# Patient Record
Sex: Male | Born: 1970
Health system: Southern US, Community
[De-identification: ages and names within clinical notes are randomized; demographics above are authoritative.]

## PROBLEM LIST (undated history)

## (undated) ENCOUNTER — Emergency Department (HOSPITAL_COMMUNITY): Admission: EM | Payer: Self-pay | Source: Home / Self Care

## (undated) DIAGNOSIS — K5732 Diverticulitis of large intestine without perforation or abscess without bleeding: Secondary | ICD-10-CM

## (undated) DIAGNOSIS — K5792 Diverticulitis of intestine, part unspecified, without perforation or abscess without bleeding: Secondary | ICD-10-CM

## (undated) HISTORY — DX: Diverticulitis of intestine, part unspecified, without perforation or abscess without bleeding: K57.92

## (undated) HISTORY — DX: Diverticulitis of large intestine without perforation or abscess without bleeding: K57.32

---

## 2008-07-12 ENCOUNTER — Ambulatory Visit (HOSPITAL_COMMUNITY): Admission: RE | Admit: 2008-07-12 | Discharge: 2008-07-12 | Payer: Self-pay | Admitting: Internal Medicine

## 2008-10-20 ENCOUNTER — Emergency Department (HOSPITAL_COMMUNITY): Admission: EM | Admit: 2008-10-20 | Discharge: 2008-10-20 | Payer: Self-pay | Admitting: Emergency Medicine

## 2009-05-02 ENCOUNTER — Ambulatory Visit (HOSPITAL_COMMUNITY): Admission: RE | Admit: 2009-05-02 | Discharge: 2009-05-02 | Payer: Self-pay | Admitting: Internal Medicine

## 2009-06-16 IMAGING — CR DG KNEE COMPLETE 4+V*R*
5 series · 5 of 5 positions shown · non-contrast
Comparison: None available.

CLINICAL DATA: Knee pain.

RIGHT KNEE - COMPLETE 4+ VIEW

[view not recorded (1 of 5)]
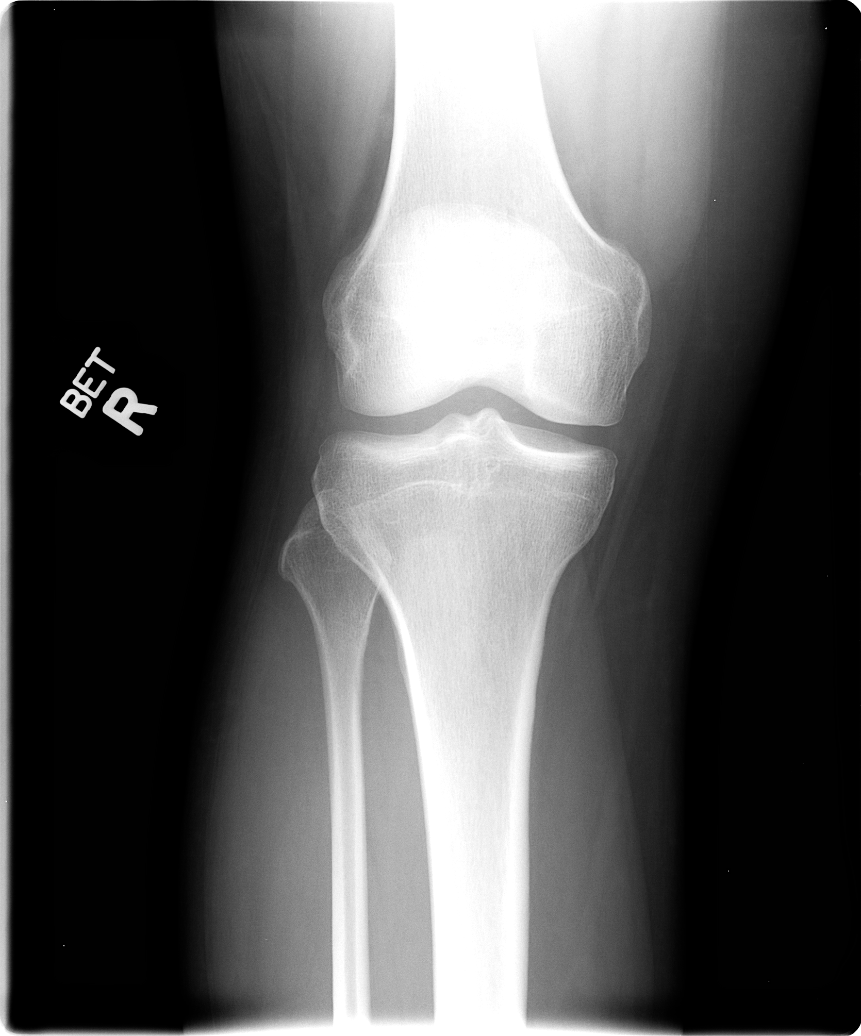

[view not recorded (2 of 5)]
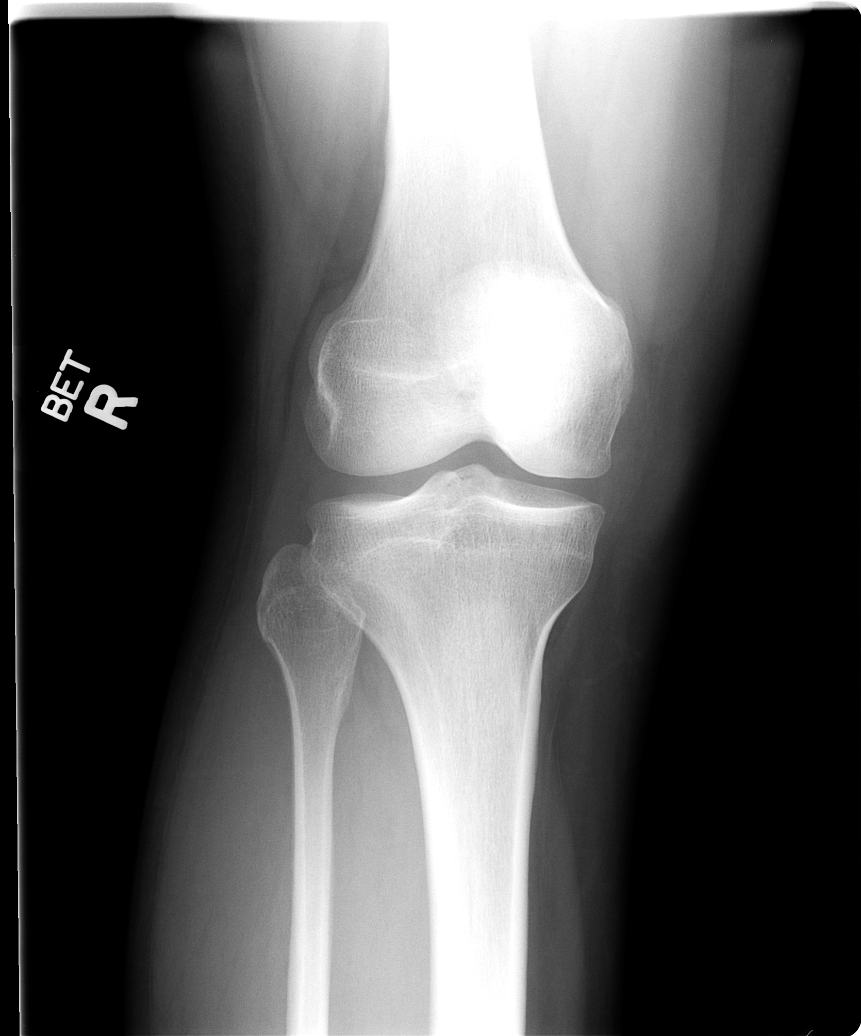

[view not recorded (3 of 5)]
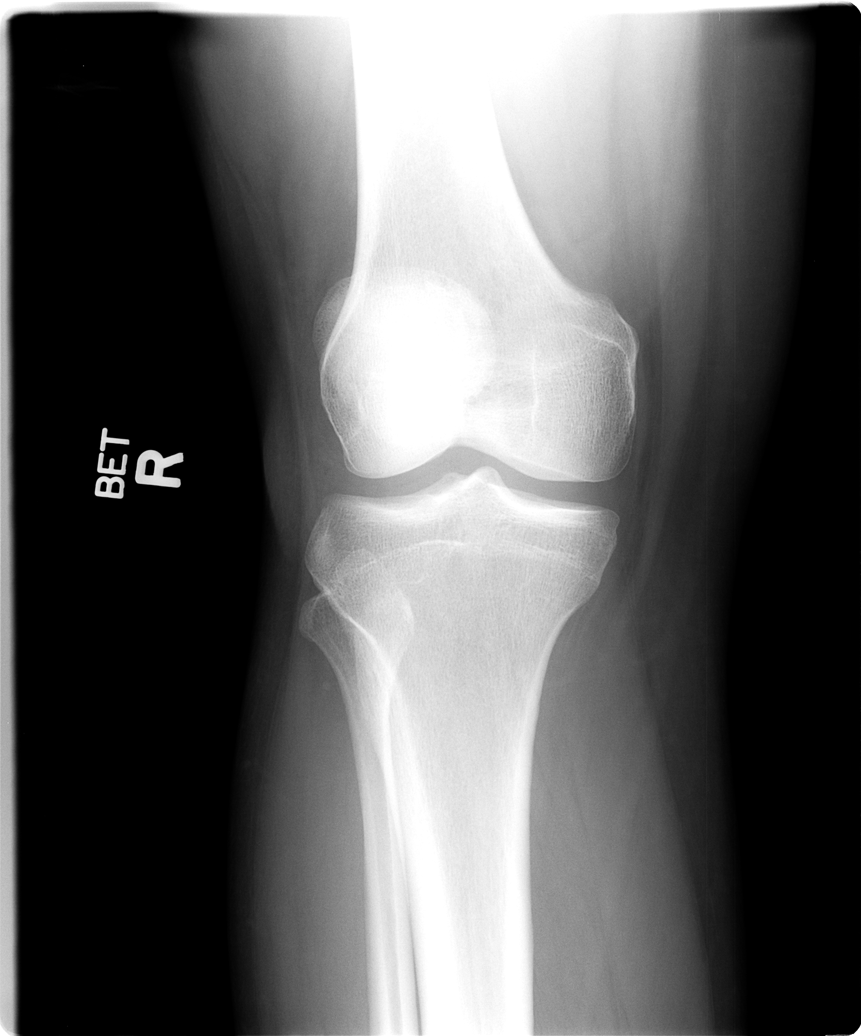

[view not recorded (4 of 5)]
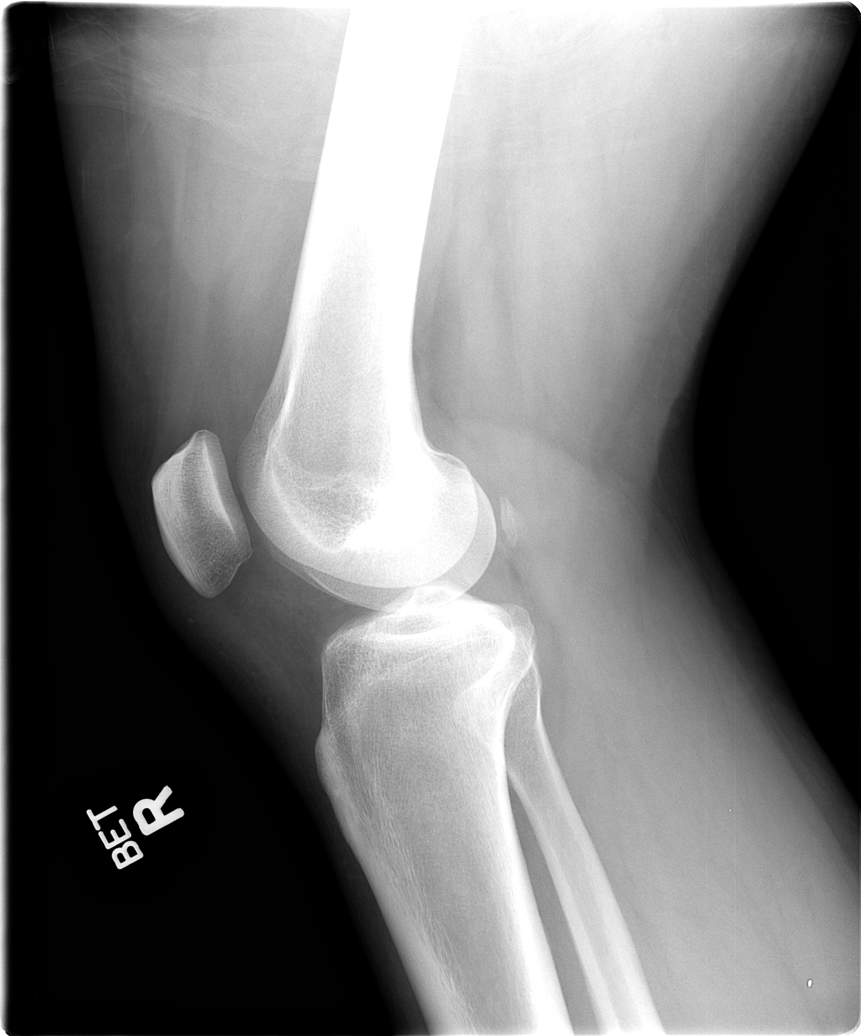

[view not recorded (5 of 5)]
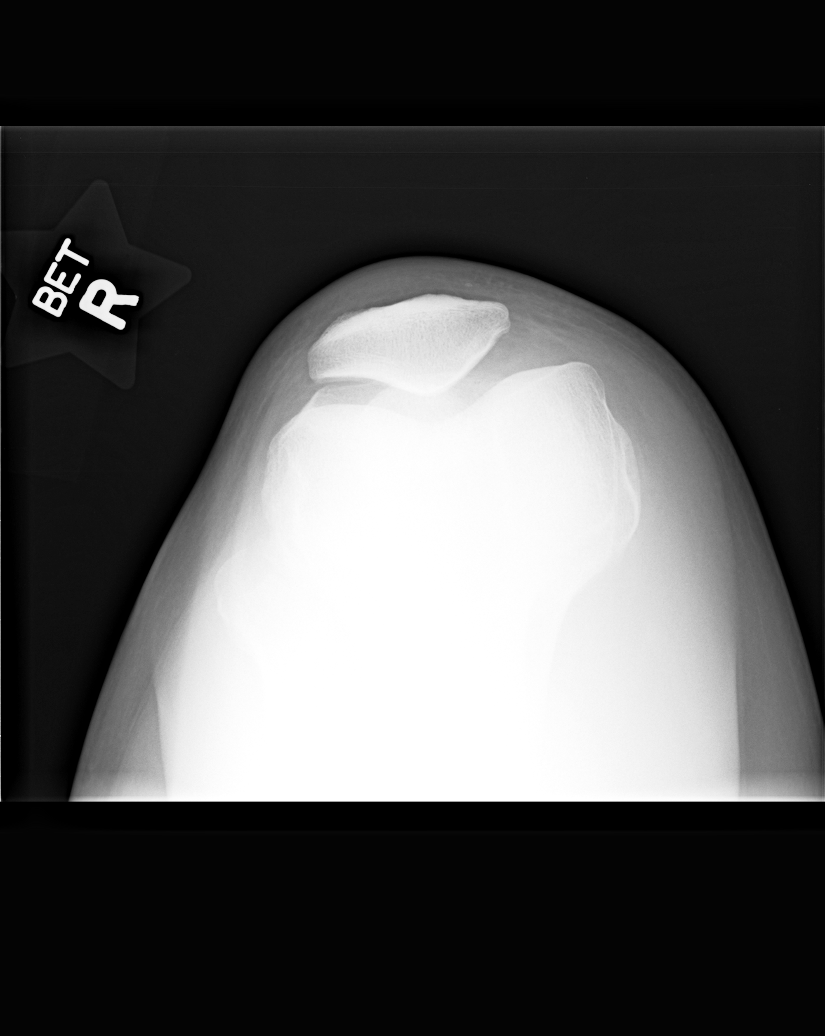

[5 of 5 positions shown; findings below may reference images not displayed]

FINDINGS: Imaged bones, joints and soft tissues appear normal. No
joint effusion.
IMPRESSION: Negative exam.

## 2009-08-12 ENCOUNTER — Emergency Department (HOSPITAL_COMMUNITY): Admission: EM | Admit: 2009-08-12 | Discharge: 2009-08-12 | Payer: Self-pay | Admitting: Emergency Medicine

## 2010-04-06 IMAGING — CR DG FOOT COMPLETE 3+V*R*
3 series · 3 of 3 positions shown · non-contrast
Comparison: None

CLINICAL DATA: Right lateral metatarsal pain.

RIGHT FOOT COMPLETE - 3+ VIEW

[view not recorded (1 of 3)]
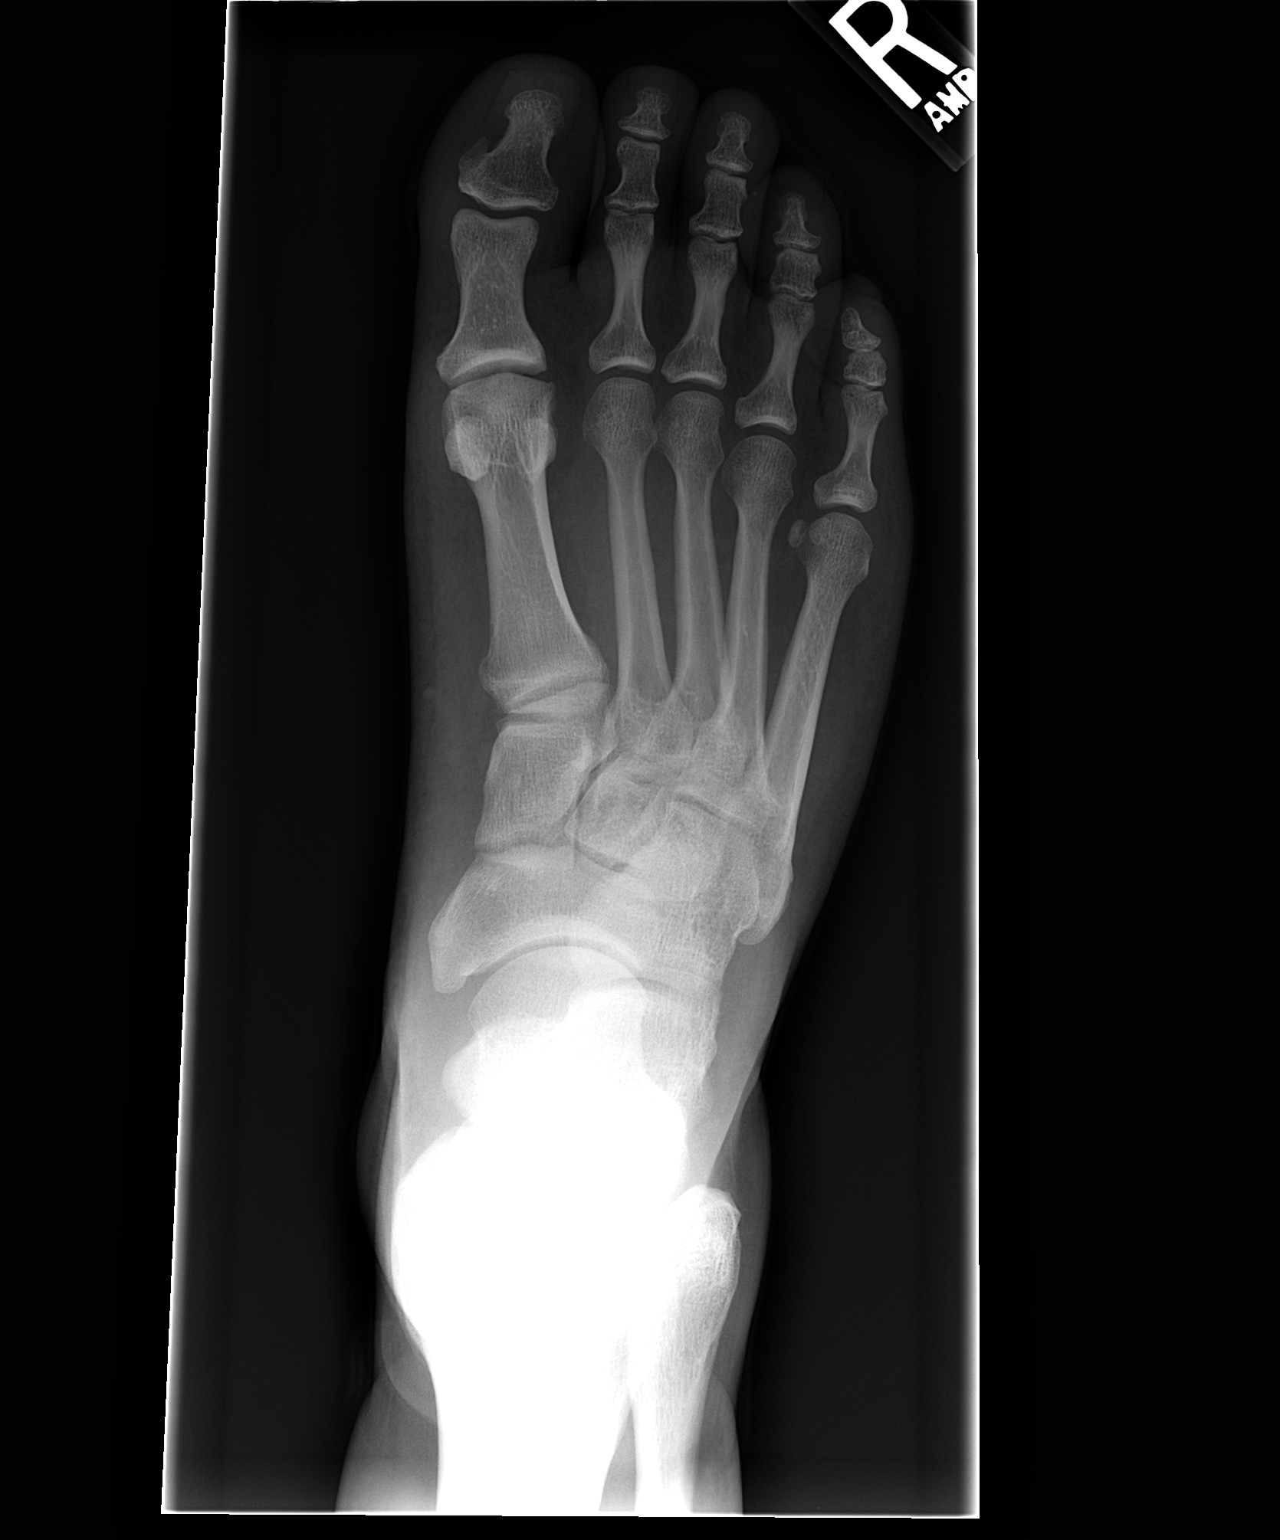

[view not recorded (2 of 3)]
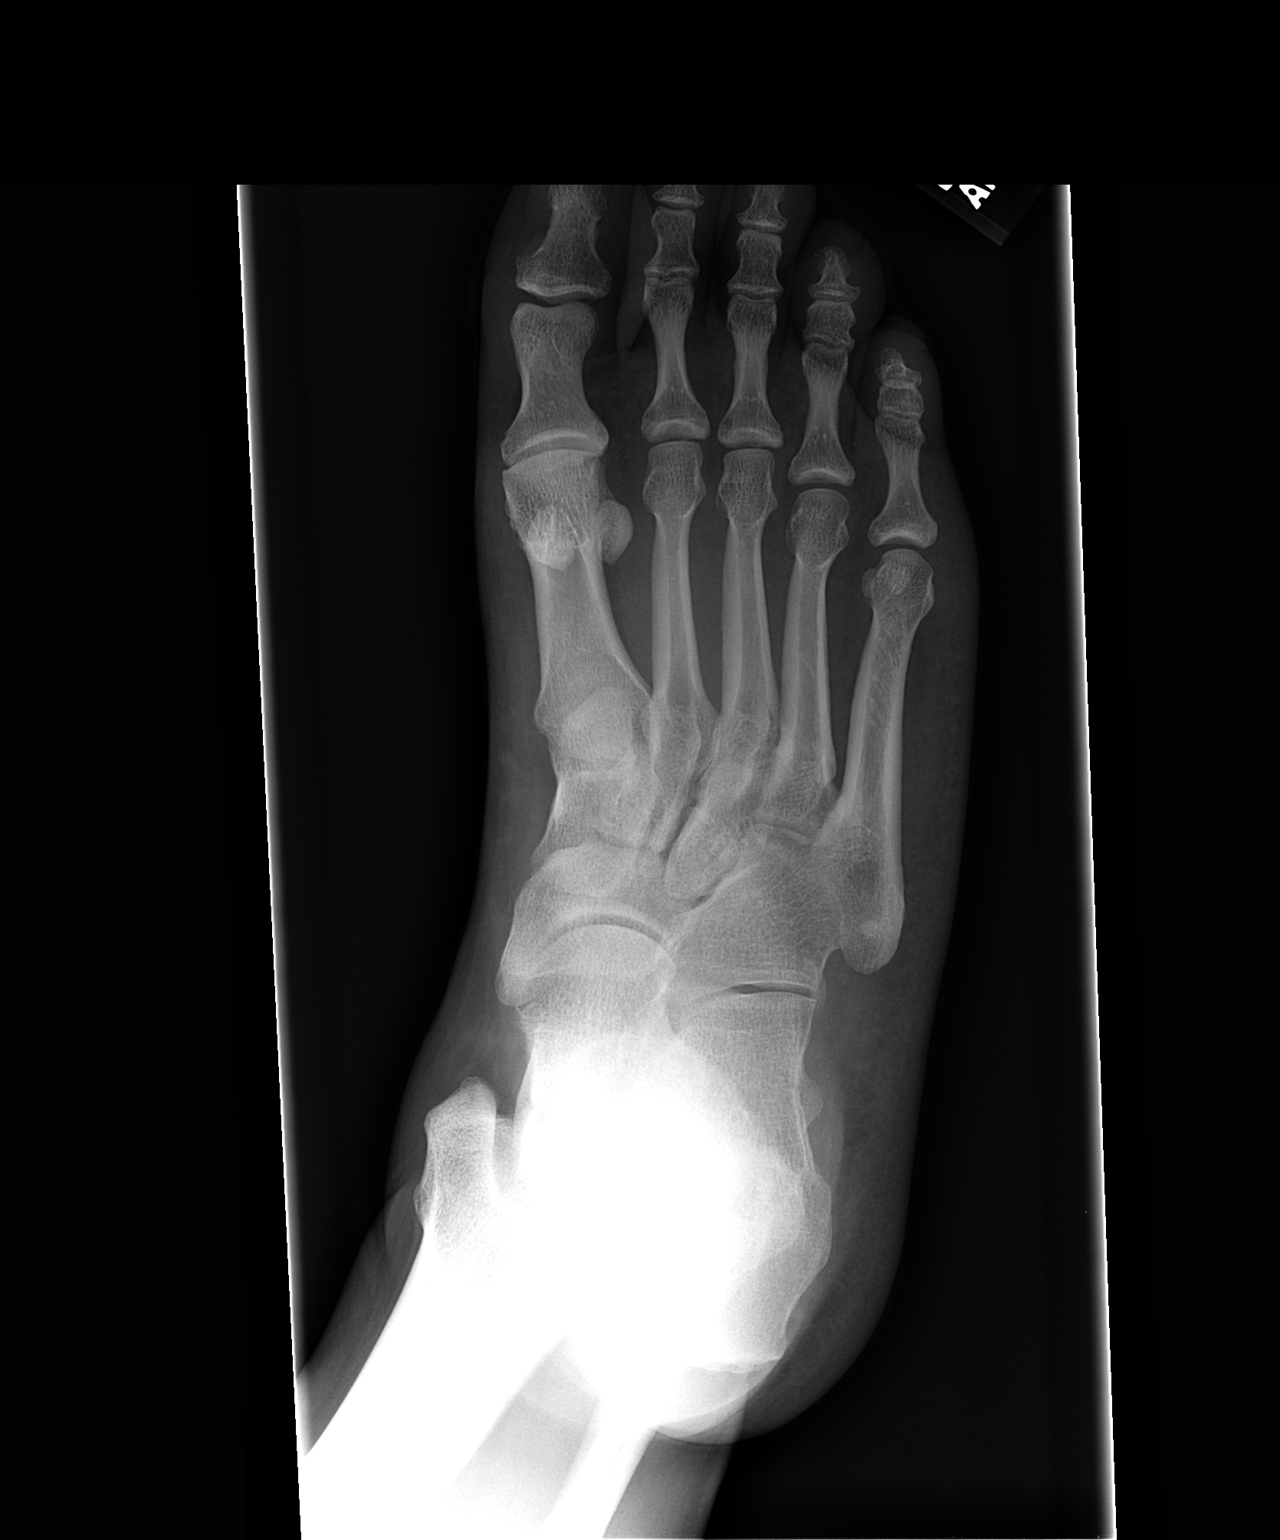

[view not recorded (3 of 3)]
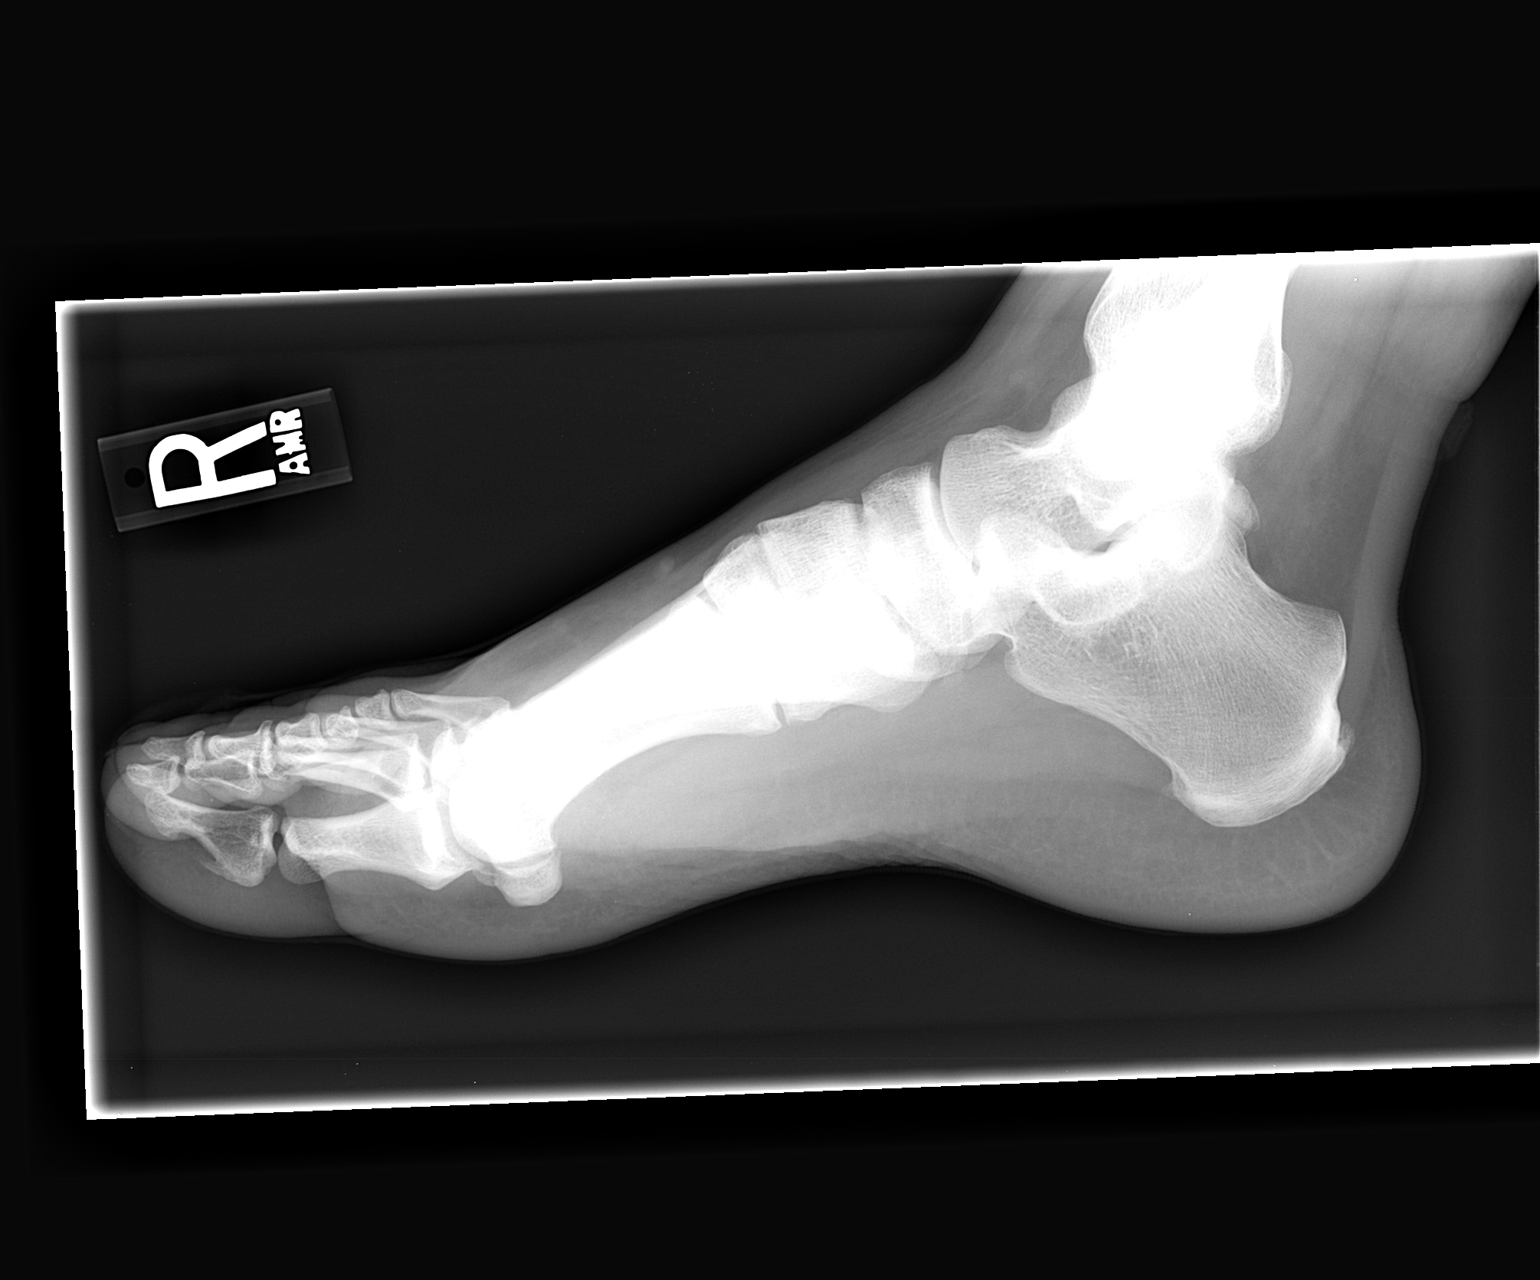

[3 of 3 positions shown; findings below may reference images not displayed]

FINDINGS: Early degenerative changes in the first MTP joint. No
acute bony abnormality.  Specifically, no fracture, subluxation, or
dislocation.  Soft tissues are intact.
IMPRESSION: No acute bony abnormality.

## 2016-08-13 DIAGNOSIS — T781XXD Other adverse food reactions, not elsewhere classified, subsequent encounter: Secondary | ICD-10-CM | POA: Diagnosis not present

## 2016-08-13 DIAGNOSIS — L509 Urticaria, unspecified: Secondary | ICD-10-CM | POA: Diagnosis not present

## 2016-08-13 DIAGNOSIS — J3081 Allergic rhinitis due to animal (cat) (dog) hair and dander: Secondary | ICD-10-CM | POA: Diagnosis not present

## 2017-05-26 DIAGNOSIS — K4091 Unilateral inguinal hernia, without obstruction or gangrene, recurrent: Secondary | ICD-10-CM | POA: Diagnosis not present

## 2017-05-28 DIAGNOSIS — N410 Acute prostatitis: Secondary | ICD-10-CM | POA: Diagnosis not present

## 2017-05-30 ENCOUNTER — Emergency Department (HOSPITAL_COMMUNITY)
Admission: EM | Admit: 2017-05-30 | Discharge: 2017-05-30 | Disposition: A | Discharge status: Home / Self Care | Payer: 59 | Attending: Emergency Medicine | Admitting: Emergency Medicine

## 2017-05-30 ENCOUNTER — Other Ambulatory Visit: Payer: Self-pay

## 2017-05-30 ENCOUNTER — Emergency Department (HOSPITAL_COMMUNITY): Payer: 59

## 2017-05-30 ENCOUNTER — Encounter (HOSPITAL_COMMUNITY): Payer: Self-pay

## 2017-05-30 DIAGNOSIS — K5732 Diverticulitis of large intestine without perforation or abscess without bleeding: Secondary | ICD-10-CM | POA: Diagnosis not present

## 2017-05-30 DIAGNOSIS — F17210 Nicotine dependence, cigarettes, uncomplicated: Secondary | ICD-10-CM | POA: Insufficient documentation

## 2017-05-30 DIAGNOSIS — R1030 Lower abdominal pain, unspecified: Secondary | ICD-10-CM | POA: Diagnosis not present

## 2017-05-30 DIAGNOSIS — K4090 Unilateral inguinal hernia, without obstruction or gangrene, not specified as recurrent: Secondary | ICD-10-CM | POA: Diagnosis not present

## 2017-05-30 DIAGNOSIS — K5792 Diverticulitis of intestine, part unspecified, without perforation or abscess without bleeding: Secondary | ICD-10-CM | POA: Diagnosis not present

## 2017-05-30 LAB — COMPREHENSIVE METABOLIC PANEL
ALK PHOS: 70 U/L (ref 38–126)
ALT: 53 U/L (ref 17–63)
AST: 31 U/L (ref 15–41)
Albumin: 4 g/dL (ref 3.5–5.0)
Anion gap: 8 (ref 5–15)
BILIRUBIN TOTAL: 0.4 mg/dL (ref 0.3–1.2)
BUN: 16 mg/dL (ref 6–20)
CALCIUM: 9.4 mg/dL (ref 8.9–10.3)
CHLORIDE: 104 mmol/L (ref 101–111)
CO2: 24 mmol/L (ref 22–32)
CREATININE: 0.95 mg/dL (ref 0.61–1.24)
GFR calc Af Amer: >60 mL/min (ref >60)
Glucose, Bld: 121 mg/dL — ABNORMAL HIGH (ref 65–99)
Potassium: 4.4 mmol/L (ref 3.5–5.1)
Sodium: 136 mmol/L (ref 135–145)
TOTAL PROTEIN: 7.4 g/dL (ref 6.5–8.1)

## 2017-05-30 LAB — CBC WITH DIFFERENTIAL/PLATELET
Basophils Absolute: 0 10*3/uL (ref 0.0–0.1)
Basophils Relative: 0 %
EOS ABS: 0 10*3/uL (ref 0.0–0.7)
EOS PCT: 0 %
HCT: 47.7 % (ref 39.0–52.0)
Hemoglobin: 15.8 g/dL (ref 13.0–17.0)
LYMPHS ABS: 1.9 10*3/uL (ref 0.7–4.0)
Lymphocytes Relative: 20 %
MCH: 29.8 pg (ref 26.0–34.0)
MCHC: 33.1 g/dL (ref 30.0–36.0)
MCV: 89.8 fL (ref 78.0–100.0)
Monocytes Absolute: 0.8 10*3/uL (ref 0.1–1.0)
Monocytes Relative: 8 %
Neutro Abs: 7 10*3/uL (ref 1.7–7.7)
Neutrophils Relative %: 72 %
PLATELETS: 253 10*3/uL (ref 150–400)
RBC: 5.31 MIL/uL (ref 4.22–5.81)
RDW: 13.1 % (ref 11.5–15.5)
WBC: 9.7 10*3/uL (ref 4.0–10.5)

## 2017-05-30 LAB — LIPASE, BLOOD: LIPASE: 56 U/L — AB (ref 11–51)

## 2017-05-30 MED ORDER — METRONIDAZOLE 500 MG PO TABS
500.0000 mg | ORAL_TABLET | Freq: Once | ORAL | Status: AC
  Administered 2017-05-30: 500 mg via ORAL
  Filled 2017-05-30: qty 1

## 2017-05-30 MED ORDER — CIPROFLOXACIN HCL 500 MG PO TABS: 500.0000 mg | ORAL_TABLET | Freq: Two times a day (BID) | ORAL | 0 refills | Status: DC

## 2017-05-30 MED ORDER — METRONIDAZOLE 500 MG PO TABS: 500.0000 mg | ORAL_TABLET | Freq: Three times a day (TID) | ORAL | 0 refills | Status: DC

## 2017-05-30 MED ORDER — SODIUM CHLORIDE 0.9 % IV BOLUS (SEPSIS)
1000.0000 mL | Freq: Once | INTRAVENOUS | Status: AC
  Administered 2017-05-30: 1000 mL via INTRAVENOUS

## 2017-05-30 MED ORDER — METRONIDAZOLE IN NACL 5-0.79 MG/ML-% IV SOLN: 500.0000 mg | Freq: Once | INTRAVENOUS | Status: DC

## 2017-05-30 NOTE — ED Notes (Signed)
Pt returned from CT Scan 

## 2017-05-30 NOTE — ED Provider Notes (Signed)
[facility] EMERGENCY DEPARTMENT Provider Note   CSN: [id number] Arrival date & time: 05/30/17  4098  Time seen 05:45 AM   History   Chief Complaint Chief Complaint  Patient presents with  . Abdominal Pain    HPI Benjamin Curtis is a 47 y.o. male.  HPI patient states he has had some swelling in his groin for about 12 years.  However he states that it got worse about a week ago.  He was seen by his PCP 1 week ago and states he pushed on it" made it go back again".  He states afterwards the pain was better.  However March 19 he said he had fever of 101.8.  He called his PCP back and they called him in Cipro.  They also did blood work and a urinalysis the following day and he was told they were normal.  He states he had no pain the 19th through the 21st.  Please note initially patient told me he had had abdominal pain constantly since last week.  He states the pain started gradually.  The pain is in his lower abdomen bilaterally.  He describes it as sharp.  He states bending over or putting pressure on his abdomen makes it hurt more, nothing makes it feel better.  He is tried gas pills without relief.  He states the pain returned during the night and woke him up about 4 AM.  He states the pain is more intense than it was when he was seen a week ago.  He denies fever, nausea, vomiting, diarrhea, or constipation.  He states he did have constipation earlier in the week but not now.  He denies any prior abdominal surgeries.  He states he saw a Careers adviser in 2017 about his hernia and he has a appointment on April 4.  After telling me he had swelling in his groin and that his PCP reduce the hernia he told me he does not notice much difference in the swelling in his groin.  PCP Carylon Perches, MD  History reviewed. No pertinent past medical history.  There are no active problems to display for this patient.   History reviewed. No pertinent surgical history.     Home Medications    Prior to  Admission medications   Medication Sig Start Date End Date Taking? Authorizing Provider  ciprofloxacin (CIPRO) 500 MG tablet Take 500 mg by mouth 2 (two) times daily. 05/26/17 06/05/17 Yes [provider]  ciprofloxacin (CIPRO) 500 MG tablet Take 1 tablet (500 mg total) by mouth 2 (two) times daily. 05/30/17   Devoria Albe, MD  metroNIDAZOLE (FLAGYL) 500 MG tablet Take 1 tablet (500 mg total) by mouth 3 (three) times daily. 05/30/17   Devoria Albe, MD    Family History Family History  Problem Relation Age of Onset  . Diabetes Mother   . Hypertension Father     Social History Social History   Tobacco Use  . Smoking status: Current Every Day Smoker    Packs/day: 1.00    Years: 20.00    Pack years: 20.00    Types: Cigarettes  . Smokeless tobacco: Never Used  Substance Use Topics  . Alcohol use: Never    Frequency: Never  . Drug use: Never  employed Smokes 4 cigs a day  Allergies   Penicillins   Review of Systems Review of Systems  All other systems reviewed and are negative.    Physical Exam Updated Vital Signs BP (!) 151/82 (BP Location: Left  Arm)   Pulse 96   Temp 99.4 F (37.4 C) (Oral)   Resp 18   Ht 5\' 6"  (1.676 m)   Wt 108.9 kg (240 lb)   SpO2 98%   BMI 38.74 kg/m   Vital signs normal except for hypertension   Physical Exam  Constitutional: He is oriented to person, place, and time. He appears well-developed and well-nourished.  Non-toxic appearance. He does not appear ill. No distress.  HENT:  Head: Normocephalic and atraumatic.  Right Ear: External ear normal.  Left Ear: External ear normal.  Nose: Nose normal. No mucosal edema or rhinorrhea.  Mouth/Throat: Oropharynx is clear and moist and mucous membranes are normal. No dental abscesses or uvula swelling.  Eyes: Pupils are equal, round, and reactive to light. Conjunctivae and EOM are normal.  Neck: Normal range of motion and full passive range of motion without pain. Neck supple.    Cardiovascular: Normal rate, regular rhythm and normal heart sounds. Exam reveals no gallop and no friction rub.  No murmur heard. Pulmonary/Chest: Effort normal and breath sounds normal. No respiratory distress. He has no wheezes. He has no rhonchi. He has no rales. He exhibits no tenderness and no crepitus.  Abdominal: Soft. Normal appearance and bowel sounds are normal. He exhibits no distension. There is tenderness in the right lower quadrant, suprapubic area and left lower quadrant. There is no rebound and no guarding.  Patient is most tender in the right lower quadrant, palpation of the left lower quadrant causes pain in the right lower quadrant.  Genitourinary: Testes normal and penis normal. Right testis shows no mass and no tenderness. Left testis shows no mass, no swelling and no tenderness.  Genitourinary Comments: Patient has some fullness in his right inguinal area without distinct mass.  Musculoskeletal: Normal range of motion. He exhibits no edema or tenderness.  Moves all extremities well.   Neurological: He is alert and oriented to person, place, and time. He has normal strength. No cranial nerve deficit.  Skin: Skin is warm, dry and intact. No rash noted. No erythema. No pallor.  Psychiatric: He has a normal mood and affect. His speech is normal and behavior is normal. His mood appears not anxious.  Nursing note and vitals reviewed.    ED Treatments / Results  Labs (all labs ordered are listed, but only abnormal results are displayed) Results for orders placed or performed during the hospital encounter of 05/30/17  Comprehensive metabolic panel  Result Value Ref Range   Sodium 136 135 - 145 mmol/L   Potassium 4.4 3.5 - 5.1 mmol/L   Chloride 104 101 - 111 mmol/L   CO2 24 22 - 32 mmol/L   Glucose, Bld 121 (H) 65 - 99 mg/dL   BUN 16 6 - 20 mg/dL   Creatinine, Ser 1.61 0.61 - 1.24 mg/dL   Calcium 9.4 8.9 - 09.6 mg/dL   Total Protein 7.4 6.5 - 8.1 g/dL   Albumin 4.0 3.5  - 5.0 g/dL   AST 31 15 - 41 U/L   ALT 53 17 - 63 U/L   Alkaline Phosphatase 70 38 - 126 U/L   Total Bilirubin 0.4 0.3 - 1.2 mg/dL   GFR calc non Af Amer >60 >60 mL/min   GFR calc Af Amer >60 >60 mL/min   Anion gap 8 5 - 15  CBC with Differential  Result Value Ref Range   WBC 9.7 4.0 - 10.5 K/uL   RBC 5.31 4.22 - 5.81 MIL/uL  Hemoglobin 15.8 13.0 - 17.0 g/dL   HCT 40.9 81.1 - 91.4 %   MCV 89.8 78.0 - 100.0 fL   MCH 29.8 26.0 - 34.0 pg   MCHC 33.1 30.0 - 36.0 g/dL   RDW 78.2 95.6 - 21.3 %   Platelets 253 150 - 400 K/uL   Neutrophils Relative % 72 %   Neutro Abs 7.0 1.7 - 7.7 K/uL   Lymphocytes Relative 20 %   Lymphs Abs 1.9 0.7 - 4.0 K/uL   Monocytes Relative 8 %   Monocytes Absolute 0.8 0.1 - 1.0 K/uL   Eosinophils Relative 0 %   Eosinophils Absolute 0.0 0.0 - 0.7 K/uL   Basophils Relative 0 %   Basophils Absolute 0.0 0.0 - 0.1 K/uL  Lipase, blood  Result Value Ref Range   Lipase 56 (H) 11 - 51 U/L     Laboratory interpretation all normal except mildly elevated lipase      EKG  EKG Interpretation None       #1 06:19 AM ED ECG REPORT   Date: 05/30/2017  Rate: 82  Rhythm: normal sinus rhythm  QRS Axis: normal  Intervals: normal  ST/T Wave abnormalities: normal  Conduction Disutrbances:none  Narrative Interpretation: baseline wander  Old EKG Reviewed: none available  I have personally reviewed the EKG tracing and agree with the computerized printout as noted.  #2 EKG 06:49 AM ED ECG REPORT   Date: 05/30/2017  Rate: 94  Rhythm: normal sinus rhythm  QRS Axis: normal  Intervals: normal  ST/T Wave abnormalities: normal  Conduction Disutrbances:none  Narrative Interpretation:   Old EKG Reviewed: unchanged  I have personally reviewed the EKG tracing and agree with the computerized printout as noted.   Radiology Ct Abdomen Pelvis Wo Contrast  Result Date: 05/30/2017 CLINICAL DATA:  Acute presentation with abdominal pain over the last week.  History of right inguinal hernia. EXAM: CT ABDOMEN AND PELVIS WITHOUT CONTRAST TECHNIQUE: Multidetector CT imaging of the abdomen and pelvis was performed following the standard protocol without IV contrast. COMPARISON:  None. FINDINGS: Lower chest: Normal Hepatobiliary: Liver parenchyma is normal. Initially, there appeared to be a 15 mm low-density at the dome of the liver, but on the reconstructed images, this can be seen to be artifactual. No calcified gallstones. Pancreas: Normal Spleen: Normal Adrenals/Urinary Tract: Adrenal glands are normal. Kidneys are normal. Bladder is normal. Stomach/Bowel: No evidence of bowel obstruction. The patient has diverticulosis of the left colon. There is marked wall thickening in the sigmoid colon with surrounding inflammatory change in the fat consistent with acute diverticulitis. No specific finding to suggest mass lesion, though that is never excluded at initial presentation. No sign of abscess. Vascular/Lymphatic: Aortic atherosclerosis. No aneurysm. IVC is normal. No retroperitoneal adenopathy. Reproductive: Normal Other: Bilateral inguinal hernias containing only fat, larger on the right than the left. No evidence incarcerated bowel or obstruction. Musculoskeletal: Ordinary degenerative changes of the lumbar spine. IMPRESSION: Acute diverticulitis of the sigmoid colon without evidence of abscess. At initial presentation, underlying sigmoid colon mass lesion is never excluded, but the findings may simply be due to diverticulitis. Routine treatment and follow-up suggested. Bilateral inguinal hernias right larger than left containing only fat. Electronically Signed   By: Paulina Fusi M.D.   On: 05/30/2017 07:13    Procedures Procedures (including critical care time)  Medications Ordered in ED Medications  metroNIDAZOLE (FLAGYL) tablet 500 mg (has no administration in time range)  sodium chloride 0.9 % bolus 1,000 mL (1,000 mLs Intravenous  New Bag/Given 05/30/17 [id number])      Initial Impression / Assessment and Plan / ED Course  I have reviewed the triage vital signs and the nursing notes.  Pertinent labs & imaging results that were available during my care of the patient were reviewed by me and considered in my medical decision making (see chart for details).      Patient was given IV fluids, he was offered pain medicine however he refused. He states he doesn't like to take things like that.    CT scan and  laboratory testing was ordered.  6:20 AM nursing staff came to me and said patient had a syncopal episode for like 10 seconds.  They state he had a normal blood pressure and pulse.  He had the episode while they were starting his IV.  Patient states he has had syncope in the past with donating blood.  7:20 AM we discussed his CT result.  Patient would prefer to get the Flagyl orally.  We discussed dietary changes for the next couple days and then once his acute flareup has resolved he can go back to the diet for diverticulosis.  We discussed he does have bilateral inguinal hernias however they both contain fat so unless they are causing him discomfort he really does not need to have surgery unless he wants to go ahead and see the surgeon.  Final Clinical Impressions(s) / ED Diagnoses   Final diagnoses:  Diverticulitis    ED Discharge Orders        Ordered    ciprofloxacin (CIPRO) 500 MG tablet  2 times daily     05/30/17 0728    metroNIDAZOLE (FLAGYL) 500 MG tablet  3 times daily     05/30/17 [id number]      Plan discharge  Devoria AlbeIva [employer]i [employer], MD, [facility]oFACEP    Theron Cumbie, MD 05/30/17 [phone]

## 2017-05-30 NOTE — ED Notes (Signed)
Before attempting to begin IV Therapy, PT turned pale and became momentarily unresponsive. When dropped to supine position RN performed a quick sternal rub and Pt began to come back to reality. Pt now diaphoretic and feels warm.

## 2017-05-30 NOTE — Discharge Instructions (Addendum)
You need to be on a liquid diet today and tomorrow, if you are doing well tomorrow afternoon you can start a bland diet.  Take the antibiotics until gone for a total of 10 days both the Cipro and the Flagyl.  Please let Dr. Alonza SmokerFagan's office know about your diagnosis.  Return to the emergency department if you get fever, or have uncontrolled vomiting or pain.  Once your symptoms have improved you need to try to eat a high-fiber diet to help prevent recurrence of diverticulitis.

## 2017-05-30 NOTE — ED Notes (Signed)
Patient transported to CT 

## 2017-05-30 NOTE — ED Triage Notes (Signed)
Pt arrived via POV from home c/o abdominal pain X1 week. Abdominal pain extends from diaphragm to groin. Pt does report a tiny right inguinal hernia but does not think it is the cause of your pain.

## 2017-06-10 ENCOUNTER — Ambulatory Visit: Payer: 59 | Admitting: General Surgery

## 2017-06-12 ENCOUNTER — Ambulatory Visit: Payer: 59 | Admitting: General Surgery

## 2017-06-19 ENCOUNTER — Ambulatory Visit: Payer: 59 | Admitting: General Surgery

## 2017-08-14 DIAGNOSIS — T781XXD Other adverse food reactions, not elsewhere classified, subsequent encounter: Secondary | ICD-10-CM | POA: Diagnosis not present

## 2017-08-14 DIAGNOSIS — J3081 Allergic rhinitis due to animal (cat) (dog) hair and dander: Secondary | ICD-10-CM | POA: Diagnosis not present

## 2017-08-14 DIAGNOSIS — L509 Urticaria, unspecified: Secondary | ICD-10-CM | POA: Diagnosis not present

## 2017-10-12 DIAGNOSIS — K5792 Diverticulitis of intestine, part unspecified, without perforation or abscess without bleeding: Secondary | ICD-10-CM | POA: Diagnosis not present

## 2018-04-13 DIAGNOSIS — K5792 Diverticulitis of intestine, part unspecified, without perforation or abscess without bleeding: Secondary | ICD-10-CM | POA: Diagnosis not present

## 2018-04-30 ENCOUNTER — Ambulatory Visit (INDEPENDENT_AMBULATORY_CARE_PROVIDER_SITE_OTHER): Payer: 59 | Admitting: Internal Medicine

## 2018-05-04 IMAGING — CT CT ABD-PELV W/O CM
2 of 4 series · 17 of 46 positions shown, 19 images · non-contrast
Comparison: None.

CLINICAL DATA: Acute presentation with abdominal pain over the last
week. History of right inguinal hernia.

EXAM:
CT ABDOMEN AND PELVIS WITHOUT CONTRAST
TECHNIQUE: Multidetector CT imaging of the abdomen and pelvis was performed
following the standard protocol without IV contrast.

[Series 2: axial st · axial · 0.96mm/px · z∈[+1136,+1521]mm · 14 of 87 slices shown, 16 images]
[im 5/87  soft-tissue]
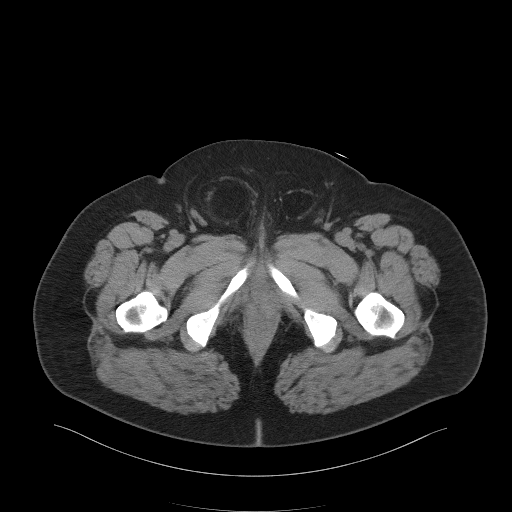
[im 5/87  bone]
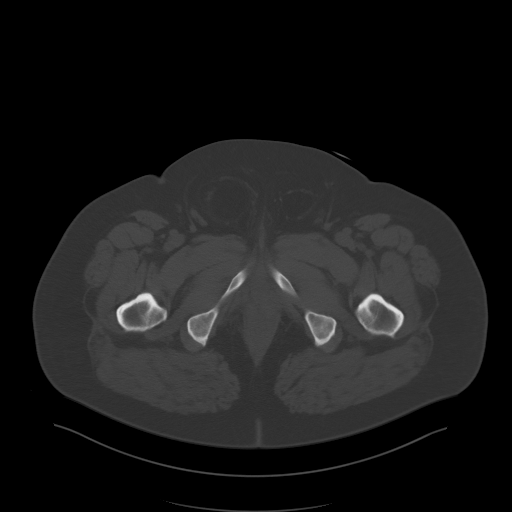
[im 13/87  soft-tissue]
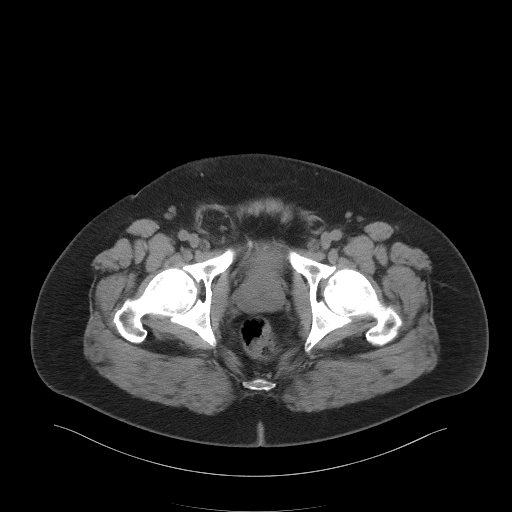
[im 17/87  soft-tissue]
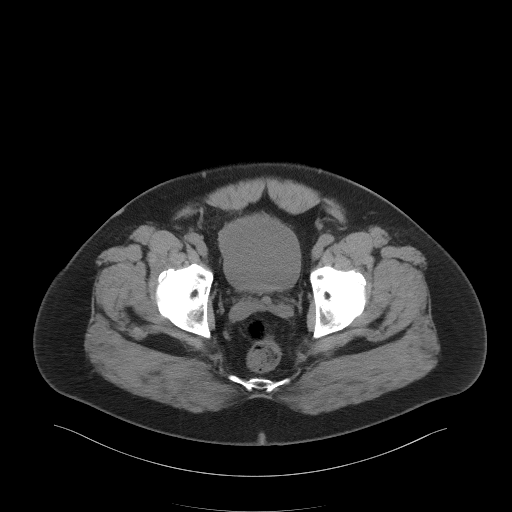
[im 25/87  soft-tissue]
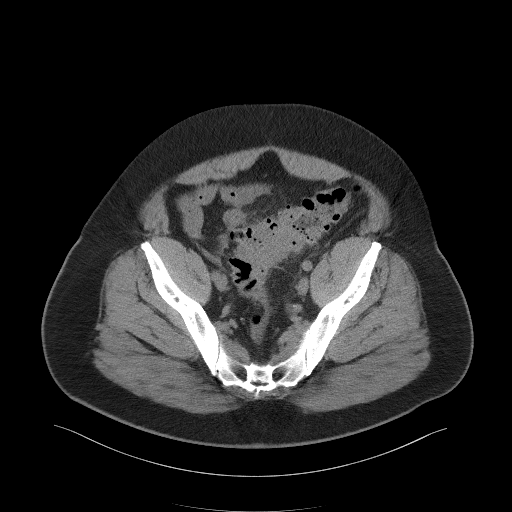
[im 29/87  soft-tissue]
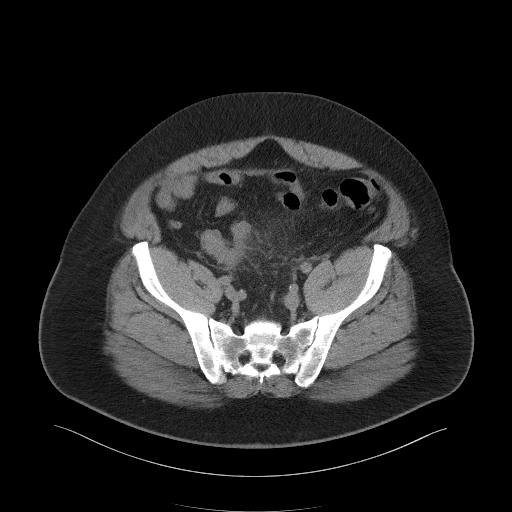
[im 33/87  soft-tissue]
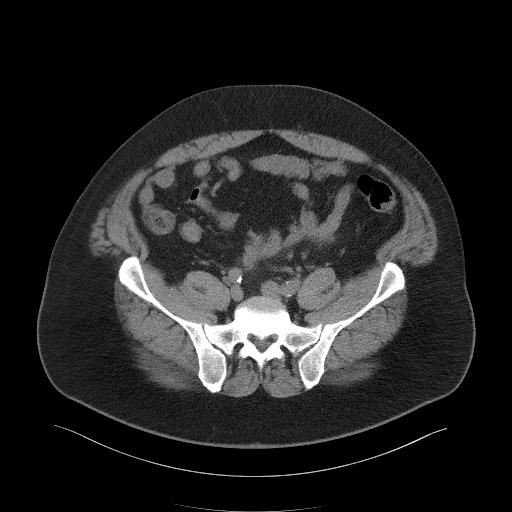
[im 41/87  soft-tissue]
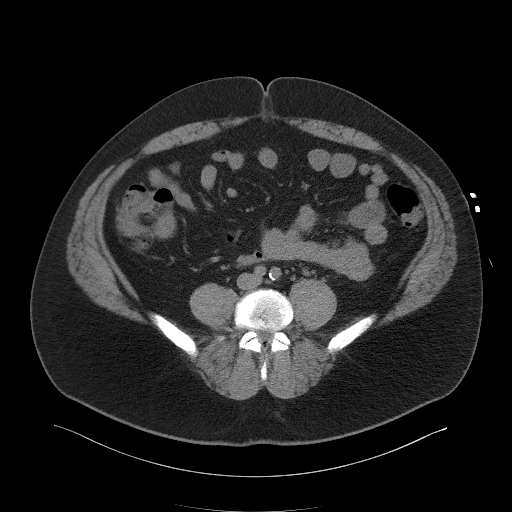
[im 46/87  soft-tissue]
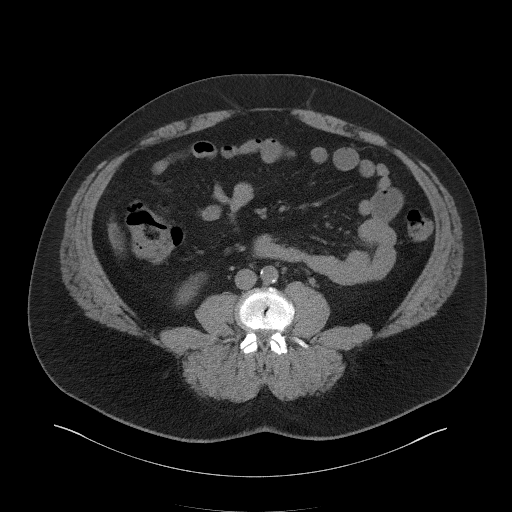
[im 54/87  soft-tissue]
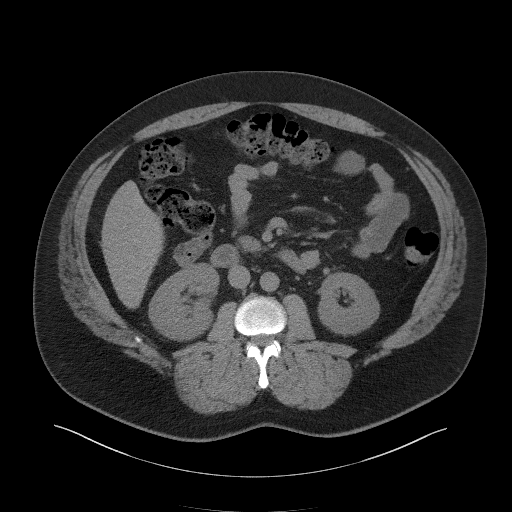
[im 54/87  bone]
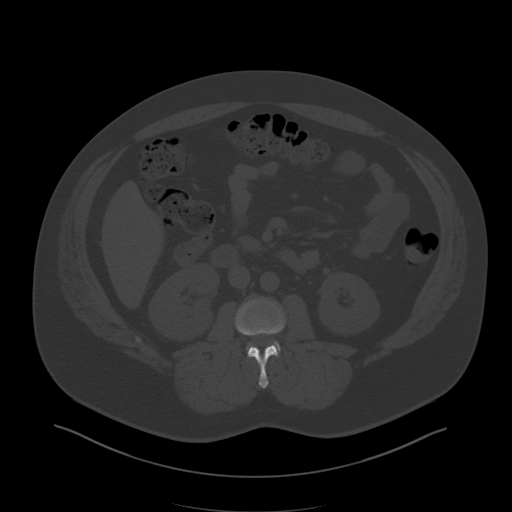
[im 58/87  soft-tissue]
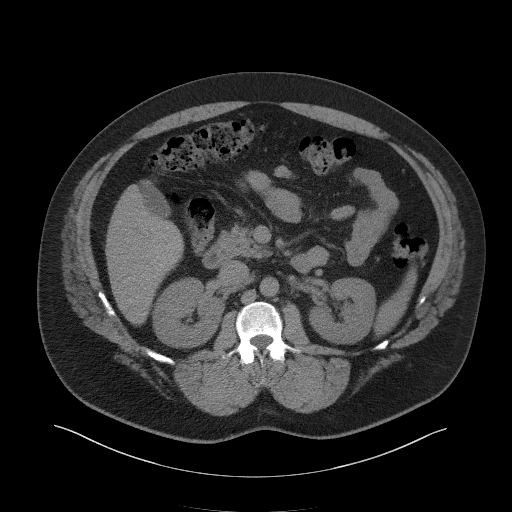
[im 66/87  soft-tissue]
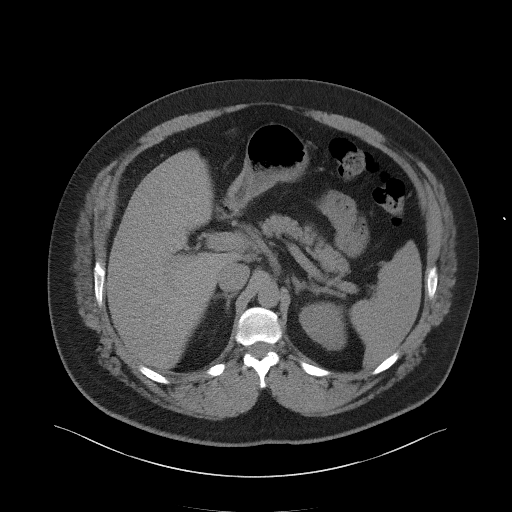
[im 70/87  soft-tissue]
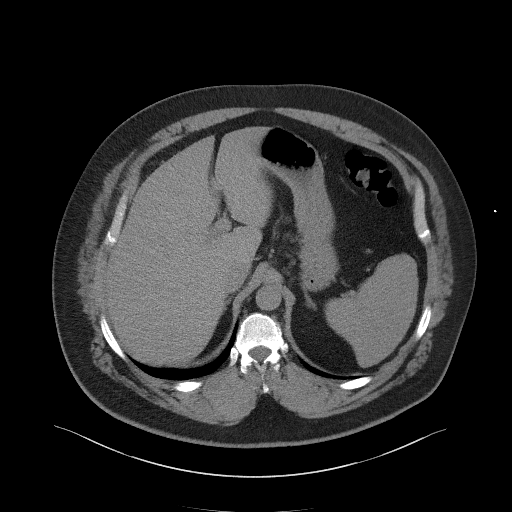
[im 74/87  soft-tissue]
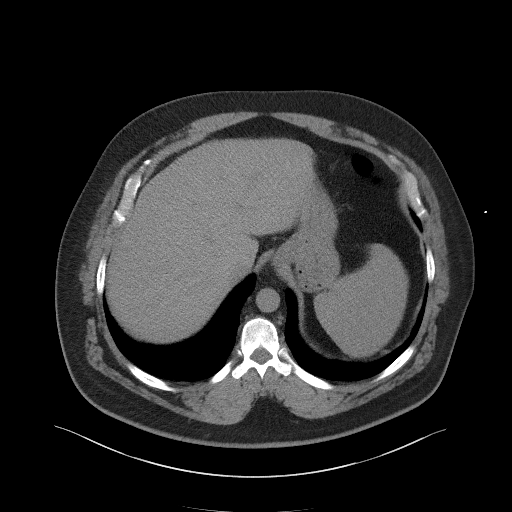
[im 82/87  soft-tissue]
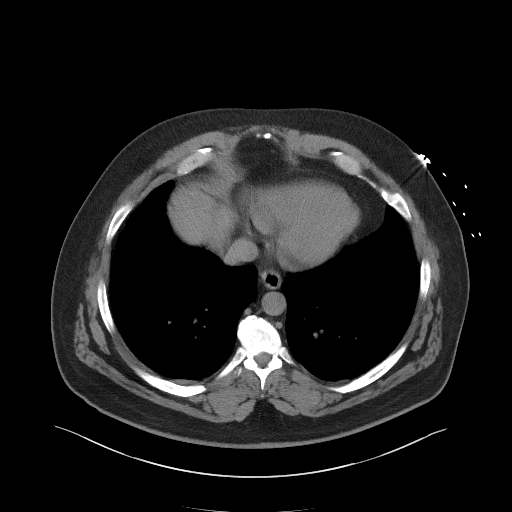

[Series 5: coronal st · coronal · 0.84mm/px · 3 of 120 slices shown]
[im 40/120  soft-tissue]
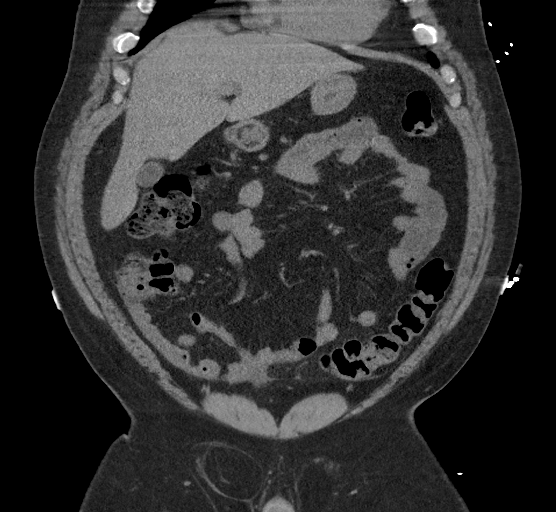
[im 53/120  soft-tissue]
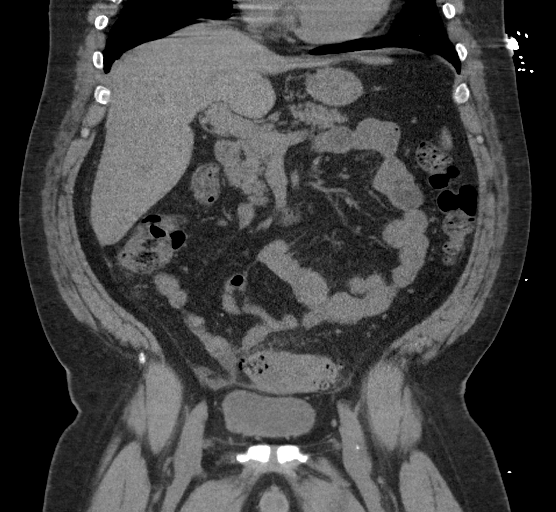
[im 67/120  soft-tissue]
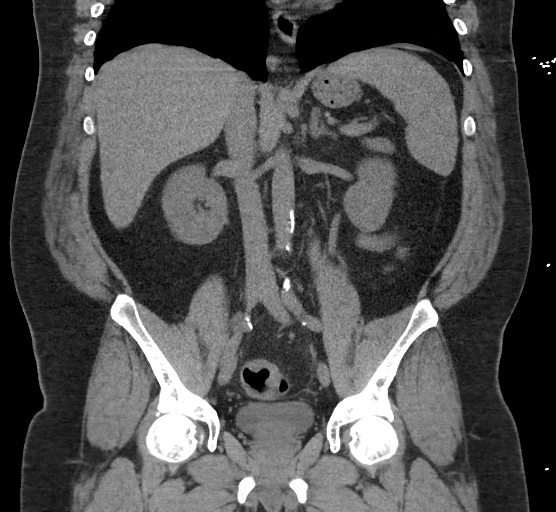

[17 of 46 positions shown; findings below may reference images not displayed]

FINDINGS: Lower chest: Normal

Hepatobiliary: Liver parenchyma is normal. Initially, there appeared
to be a 15 mm low-density at the dome of the liver, but on the
reconstructed images, this can be seen to be artifactual. No
calcified gallstones.

Pancreas: Normal

Spleen: Normal

Adrenals/Urinary Tract: Adrenal glands are normal. Kidneys are
normal. Bladder is normal.

Stomach/Bowel: No evidence of bowel obstruction. The patient has
diverticulosis of the left colon. There is marked wall thickening in
the sigmoid colon with surrounding inflammatory change in the fat
consistent with acute diverticulitis. No specific finding to suggest
mass lesion, though that is never excluded at initial presentation.
No sign of abscess.

Vascular/Lymphatic: Aortic atherosclerosis. No aneurysm. IVC is
normal. No retroperitoneal adenopathy.

Reproductive: Normal

Other: Bilateral inguinal hernias containing only fat, larger on the
right than the left. No evidence incarcerated bowel or obstruction.

Musculoskeletal: Ordinary degenerative changes of the lumbar spine.
IMPRESSION: Acute diverticulitis of the sigmoid colon without evidence of
abscess. At initial presentation, underlying sigmoid colon mass
lesion is never excluded, but the findings may simply be due to
diverticulitis. Routine treatment and follow-up suggested.

Bilateral inguinal hernias right larger than left containing only
fat.

## 2018-05-14 ENCOUNTER — Ambulatory Visit (INDEPENDENT_AMBULATORY_CARE_PROVIDER_SITE_OTHER): Payer: 59 | Admitting: Internal Medicine

## 2018-05-14 ENCOUNTER — Encounter (INDEPENDENT_AMBULATORY_CARE_PROVIDER_SITE_OTHER): Payer: Self-pay | Admitting: *Deleted

## 2018-05-14 ENCOUNTER — Encounter (INDEPENDENT_AMBULATORY_CARE_PROVIDER_SITE_OTHER): Payer: Self-pay | Admitting: Internal Medicine

## 2018-05-14 ENCOUNTER — Telehealth (INDEPENDENT_AMBULATORY_CARE_PROVIDER_SITE_OTHER): Payer: Self-pay | Admitting: *Deleted

## 2018-05-14 DIAGNOSIS — K5732 Diverticulitis of large intestine without perforation or abscess without bleeding: Secondary | ICD-10-CM | POA: Diagnosis not present

## 2018-05-14 HISTORY — DX: Diverticulitis of large intestine without perforation or abscess without bleeding: K57.32

## 2018-05-14 MED ORDER — SUPREP BOWEL PREP KIT 17.5-3.13-1.6 GM/177ML PO SOLN: 1.0000 | Freq: Once | ORAL | 0 refills | Status: AC

## 2018-05-14 NOTE — Progress Notes (Signed)
   Subjective:    Patient ID: Benjamin Curtis, male    DOB: June 23, 1970, 48 y.o.   MRN: [id number]  HPI  Referred by Dr. Ouida Sills for LLQ pain/diverticulitis. In March of 2019, CT scan ( 05/30/2017)revealed acute diverticulitis of the sigmoid colon without evidence of abscess. Covered with Cipro and Flagyl. States had a bout of diverticulitis in July of 2019 and in January of this year. There is no pain today. He is asymptomatic. Appetite is okay. Weight loss which was intentional. No change in his bowel.  No family hx of colon cancer.      Review of Systems Past Medical History:  Diagnosis Date  . Diverticulitis   . Diverticulitis of colon 05/14/2018    No past surgical history on file.  Allergies  Allergen Reactions  . Penicillins Rash    No current outpatient medications on file prior to visit.   No current facility-administered medications on file prior to visit.         Objective:   Physical Exam Blood pressure 117/70, pulse 67, temperature 98.3 F (36.8 C), height 5\' 6"  (1.676 m), weight 237 lb 9.6 oz (107.8 kg). Alert and oriented. Skin warm and dry. Oral mucosa is moist.   . Sclera anicteric, conjunctivae is pink. Thyroid not enlarged. No cervical lymphadenopathy. Faint bilateral wheezes.  Heart regular rate and rhythm.  Abdomen is soft. Bowel sounds are positive. No hepatomegaly. No abdominal masses felt. No tenderness.  No edema to lower extremities.           Assessment & Plan:  Hx of diverticulitis. Needs to be sure he does not have an underlying colon cancer. The risks of bleeding, perforation and infection were reviewed with patient.

## 2018-05-14 NOTE — Telephone Encounter (Signed)
Patient needs suprep 

## 2018-05-14 NOTE — Patient Instructions (Signed)
The risks of bleeding, perforation and infection were reviewed with patient.  

## 2018-05-26 DIAGNOSIS — I700 Atherosclerosis of aorta: Secondary | ICD-10-CM | POA: Diagnosis not present

## 2018-05-26 DIAGNOSIS — K5792 Diverticulitis of intestine, part unspecified, without perforation or abscess without bleeding: Secondary | ICD-10-CM | POA: Diagnosis not present

## 2018-07-15 ENCOUNTER — Encounter (HOSPITAL_COMMUNITY): Payer: Self-pay

## 2018-07-15 ENCOUNTER — Ambulatory Visit (HOSPITAL_COMMUNITY): Admit: 2018-07-15 | Payer: 59 | Admitting: Internal Medicine

## 2018-07-15 SURGERY — COLONOSCOPY
Anesthesia: Moderate Sedation

## 2019-06-15 DIAGNOSIS — Z23 Encounter for immunization: Secondary | ICD-10-CM | POA: Diagnosis not present

## 2020-01-29 DIAGNOSIS — Z23 Encounter for immunization: Secondary | ICD-10-CM | POA: Diagnosis not present

## 2021-04-25 DIAGNOSIS — K5792 Diverticulitis of intestine, part unspecified, without perforation or abscess without bleeding: Secondary | ICD-10-CM | POA: Diagnosis not present

## 2021-05-11 ENCOUNTER — Emergency Department (HOSPITAL_COMMUNITY): Payer: BC Managed Care – PPO

## 2021-05-11 ENCOUNTER — Emergency Department (HOSPITAL_COMMUNITY)
Admission: EM | Admit: 2021-05-11 | Discharge: 2021-05-11 | Disposition: A | Discharge status: Home / Self Care | Payer: BC Managed Care – PPO | Attending: Emergency Medicine | Admitting: Emergency Medicine

## 2021-05-11 ENCOUNTER — Other Ambulatory Visit: Payer: Self-pay

## 2021-05-11 ENCOUNTER — Ambulatory Visit
Admission: EM | Admit: 2021-05-11 | Discharge: 2021-05-11 | Disposition: A | Discharge status: Home / Self Care | Payer: BC Managed Care – PPO | Attending: Urgent Care | Admitting: Urgent Care

## 2021-05-11 DIAGNOSIS — M549 Dorsalgia, unspecified: Secondary | ICD-10-CM | POA: Insufficient documentation

## 2021-05-11 DIAGNOSIS — R1031 Right lower quadrant pain: Secondary | ICD-10-CM | POA: Insufficient documentation

## 2021-05-11 DIAGNOSIS — Z8719 Personal history of other diseases of the digestive system: Secondary | ICD-10-CM | POA: Diagnosis not present

## 2021-05-11 DIAGNOSIS — R1011 Right upper quadrant pain: Secondary | ICD-10-CM | POA: Diagnosis not present

## 2021-05-11 DIAGNOSIS — K5730 Diverticulosis of large intestine without perforation or abscess without bleeding: Secondary | ICD-10-CM | POA: Diagnosis not present

## 2021-05-11 DIAGNOSIS — I700 Atherosclerosis of aorta: Secondary | ICD-10-CM | POA: Diagnosis not present

## 2021-05-11 LAB — CBC
HCT: 48.3 % (ref 39.0–52.0)
Hemoglobin: 15.6 g/dL (ref 13.0–17.0)
MCH: 29.1 pg (ref 26.0–34.0)
MCHC: 32.3 g/dL (ref 30.0–36.0)
MCV: 90.1 fL (ref 80.0–100.0)
Platelets: 346 10*3/uL (ref 150–400)
RBC: 5.36 MIL/uL (ref 4.22–5.81)
RDW: 13.3 % (ref 11.5–15.5)
WBC: 7 10*3/uL (ref 4.0–10.5)
nRBC: 0 % (ref 0.0–0.2)

## 2021-05-11 LAB — COMPREHENSIVE METABOLIC PANEL
ALT: 24 U/L (ref 0–44)
AST: 19 U/L (ref 15–41)
Albumin: 4.5 g/dL (ref 3.5–5.0)
Alkaline Phosphatase: 55 U/L (ref 38–126)
Anion gap: 9 (ref 5–15)
BUN: 13 mg/dL (ref 6–20)
CO2: 28 mmol/L (ref 22–32)
Calcium: 9.8 mg/dL (ref 8.9–10.3)
Chloride: 103 mmol/L (ref 98–111)
Creatinine, Ser: 0.85 mg/dL (ref 0.61–1.24)
GFR, Estimated: >60 mL/min (ref >60)
Glucose, Bld: 88 mg/dL (ref 70–99)
Potassium: 4.6 mmol/L (ref 3.5–5.1)
Sodium: 140 mmol/L (ref 135–145)
Total Bilirubin: 0.6 mg/dL (ref 0.3–1.2)
Total Protein: 7.7 g/dL (ref 6.5–8.1)

## 2021-05-11 LAB — LIPASE, BLOOD: Lipase: 43 U/L (ref 11–51)

## 2021-05-11 MED ORDER — IOHEXOL 300 MG/ML  SOLN
100.0000 mL | Freq: Once | INTRAMUSCULAR | Status: AC | PRN
  Administered 2021-05-11: 100 mL via INTRAVENOUS

## 2021-05-11 NOTE — ED Provider Notes (Signed)
?[facility] ?Provider Note ? ? ?CSN: [id number] ?Arrival date & time: 05/11/21  1331 ? ?  ? ?History ? ?Chief Complaint  ?Patient presents with  ? Abdominal Pain  ? Back Pain  ? ? ?QUENTEZ CASTLETON is a 51 y.o. male. ? ? ?Abdominal Pain ?Associated symptoms: no chest pain, no chills, no constipation, no cough, no diarrhea, no fever, no nausea, no shortness of breath and no vomiting   ?Back Pain ?Associated symptoms: abdominal pain   ?Associated symptoms: no chest pain, no fever, no headaches and no weakness   ? ?  ? ? ?ASSER DAIL is a 51 y.o. male with past medical history for diverticulitis who presents to the Emergency Department from local urgent care for further evaluation of right flank and right lower abdominal pain.  Symptoms have been present for 5 days.  He describes an aching pain from his flank that radiates to his right lower quadrant.  Pain is worse with intake of certain foods.  He has been treated 2-1/2 weeks ago for a diverticulitis flare and is recently completed a course of antibiotics.  He states that pain was of his left lower quadrant and is no longer present.  Current right lower quadrant pain is not associated with movement, he denies having fever, chills, urinary changes, constipation or diarrhea.  He denies known injury or history of kidney stones. ? ? ?Home Medications ?Prior to Admission medications   ?Not on File  ?   ? ?Allergies    ?Penicillins   ? ?Review of Systems   ?Review of Systems  ?Constitutional:  Negative for chills and fever.  ?Respiratory:  Negative for cough and shortness of breath.   ?Cardiovascular:  Negative for chest pain.  ?Gastrointestinal:  Positive for abdominal pain. Negative for constipation, diarrhea, nausea and vomiting.  ?Genitourinary:  Positive for flank pain.  ?Musculoskeletal:  Negative for back pain, neck pain and neck stiffness.  ?Skin:  Negative for rash and wound.  ?Neurological:  Negative for dizziness, weakness and headaches.   ?All other systems reviewed and are negative. ? ?Physical Exam ?Updated Vital Signs ?BP 113/72 (BP Location: Right Arm)   Pulse 79   Temp 98.5 ?F (36.9 ?C)   Resp 16   Ht 5\' 6"  (1.676 m)   Wt 107.8 kg   SpO2 95%   BMI 38.36 kg/m?  ?Physical Exam ?Vitals and nursing note reviewed.  ?Constitutional:   ?   Appearance: He is well-developed. He is not ill-appearing or toxic-appearing.  ?Cardiovascular:  ?   Rate and Rhythm: Normal rate and regular rhythm.  ?Pulmonary:  ?   Effort: Pulmonary effort is normal.  ?Chest:  ?   Chest wall: No tenderness.  ?Abdominal:  ?   Palpations: Abdomen is soft.  ?   Tenderness: There is abdominal tenderness in the right lower quadrant.  ?   Comments: Patient has some mild tenderness of the right lower quadrant.  There is no guarding or rebound tenderness.  No peritoneal signs.  No CVA tenderness.  ?Skin: ?   General: Skin is warm.  ?   Capillary Refill: Capillary refill takes less than 2 seconds.  ?   Findings: No rash.  ?Neurological:  ?   General: No focal deficit present.  ?   Mental Status: He is alert.  ? ? ?ED Results / Procedures / Treatments   ?Labs ?(all labs ordered are listed, but only abnormal results are displayed) ?Labs Reviewed  ?LIPASE, BLOOD  ?  COMPREHENSIVE METABOLIC PANEL  ?CBC  ? ? ?EKG ?None ? ?Radiology ?CT ABDOMEN PELVIS W CONTRAST ? ?Result Date: 05/11/2021 ?CLINICAL DATA:  Right lower quadrant pain, low pelvic and low back pain for 5 days EXAM: CT ABDOMEN AND PELVIS WITH CONTRAST TECHNIQUE: Multidetector CT imaging of the abdomen and pelvis was performed using the standard protocol following bolus administration of intravenous contrast. RADIATION DOSE REDUCTION: This exam was performed according to the departmental dose-optimization program which includes automated exposure control, adjustment of the mA and/or kV according to patient size and/or use of iterative reconstruction technique. CONTRAST:  151mL OMNIPAQUE IOHEXOL 300 MG/ML  SOLN COMPARISON:   05/30/2017 FINDINGS: Lower chest: No acute abnormality. Hepatobiliary: No solid liver abnormality is seen. No gallstones, gallbladder wall thickening, or biliary dilatation. Pancreas: Unremarkable. No pancreatic ductal dilatation or surrounding inflammatory changes. Spleen: Normal in size without significant abnormality. Adrenals/Urinary Tract: Adrenal glands are unremarkable. Kidneys are normal, without renal calculi, solid lesion, or hydronephrosis. Bladder is unremarkable. Stomach/Bowel: Stomach is within normal limits. Appendix appears normal. No evidence of bowel wall thickening, distention, or inflammatory changes. Descending and sigmoid diverticulosis. Vascular/Lymphatic: Aortic atherosclerosis. No enlarged abdominal or pelvic lymph nodes. Reproductive: Prostatomegaly. Other: Fat containing bilateral inguinal hernias.  No ascites. Musculoskeletal: No acute or significant osseous findings. IMPRESSION: 1. No acute CT findings of the abdomen or pelvis to explain right lower quadrant or low back pain. Normal appendix. 2. Descending and sigmoid diverticulosis without evidence of acute diverticulitis. 3. Prostatomegaly. 4. Fat containing bilateral inguinal hernias. Aortic Atherosclerosis (ICD10-I70.0). Electronically Signed   By: Delanna Ahmadi M.D.   On: 05/11/2021 17:29  ? ?US Abdomen Limited ? ?Result Date: 05/11/2021 ?CLINICAL DATA:  Right upper quadrant pain for the past week. EXAM: ULTRASOUND ABDOMEN LIMITED RIGHT UPPER QUADRANT COMPARISON:  CT abdomen pelvis dated May 30, 2017. FINDINGS: Gallbladder: No gallstones or wall thickening visualized. No sonographic Murphy sign noted by sonographer. Common bile duct: Diameter: 4 mm Liver: No focal lesion identified. Increased in parenchymal echogenicity. Portal vein is patent on color Doppler imaging with normal direction of blood flow towards the liver. Other: None. IMPRESSION: 1. No acute abnormality. 2. Diffusely increased hepatic parenchymal echogenicity,  nonspecific, but suggestive of steatosis. Electronically Signed   By: Titus Dubin M.D.   On: 05/11/2021 15:35   ? ? ?Procedures ?Procedures  ? ? ?Medications Ordered in ED ?Medications - No data to display ? ?ED Course/ Medical Decision Making/ A&P ?  ?                        ?Medical Decision Making ?Patient comes here from local urgent care for evaluation of right lower quadrant and right flank pain.  No history of kidney stones and he denies dysuria symptoms.  Pain seems to be triggered by food intake. ? ?On exam, patient well-appearing nontoxic.  Differential diagnosis would include gallbladder disease, appendicitis, kidney stone pyelonephritis.  Given history of diverticulitis with recent treatment with antibiotics I feel this is less likely, also patient's present symptoms are on his right side. ? ?Amount and/or Complexity of Data Reviewed ?External Data Reviewed: notes. ?   Details: Prior medical records reviewed by me ?Labs: ordered. ?   Details: Labs obtained and interpreted by me, no evidence of leukocytosis, chemtries and lipase unremarkable. U/A ordred but pt requested to leave prior to collection ?Radiology: ordered. ?   Details: Ultrasound imaging of the right upper abdomen is without evidence of gallbladder wall thickening or cholelithiasis.  We will proceed with CT imaging of the abdomen and pelvis. ? ?CT abd/pelvis w/o acute abominal process ? ?Risk ?Prescription drug management. ? ?Pt requesting to leave prior to collection of urine.  Reports feeling better.  CT abd/pelvis and [place] abd are both ressurring.  I doubt acute process. He agrees to close out pt f/u with PCP, I have discussed importance of ER return if sx's return or worsen, he agrees to plan.   ? ? ? ? ? ? ? ? ? ?Final Clinical Impression(s) / ED Diagnoses ?Final diagnoses:  ?Right lower quadrant abdominal pain  ? ? ?Rx / DC Orders ?ED Discharge Orders   ? ? None  ? ?  ? ? ?  ?Kem Parkinson, PA-C ?05/14/21 1434 ? ?  ?Davonna Belling, MD ?05/15/21 1230 ? ?

## 2021-05-11 NOTE — Discharge Instructions (Signed)
Please head to the hospital as you will need a CT scan of the abdomen to rule out an acute abdomen, acute recurrent diverticulosis that did not respond to a recent antibiotic course.  ?

## 2021-05-11 NOTE — ED Triage Notes (Signed)
Pt reports lower quadrant abdominal pain radiates to flank after eating x 4 days.  ?

## 2021-05-11 NOTE — ED Notes (Signed)
Patient is being discharged from the Urgent Care and sent to the Emergency Department via POV . Per PA, patient is in need of higher level of care due to need for CT scan and further evaluation. Patient is aware and verbalizes understanding of plan of care.  ?Vitals:  ? 05/11/21 1304  ?BP: 120/72  ?Pulse: 84  ?Resp: 20  ?Temp: 98.7 ?F (37.1 ?C)  ?SpO2: 96%  ?  ?

## 2021-05-11 NOTE — Discharge Instructions (Signed)
The ultrasound and CT of your abdomen were both reassuring.  The source of your abdominal pain is unclear at this time.  Since your symptoms are associated with food intake, I would suggest that you try taking Prilosec once daily as directed.  You may buy this without a prescription.  Please follow-up with your primary care provider next week for recheck.  Return to the emergency department for any new or worsening symptoms. ?

## 2021-05-11 NOTE — ED Provider Notes (Signed)
?[facility] ? ? ?MRN: [id number] DOB: [birth year] ? ?Subjective:  ? ?Benjamin Curtis is a 51 y.o. male presenting for 4-day history of persistent right-sided lower abdominal pain that radiates along his flank side and up to the right upper quadrant.  Symptoms are occurring consistently when he eats.  The symptoms are severe and becoming more constant.  Has a past medical history of diverticulosis of the left colon.  He did have diverticulitis of the sigmoid colon in 2019.  He actually was treated empirically again 2 to 3 weeks ago for recurrent left lower abdominal pain.  His PCP advised that he come to be evaluated in our clinic.  He is a smoker.  No fever, bloody stools, nausea, vomiting, chest pain, shortness of breath. ? ?No current facility-administered medications for this encounter. ?No current outpatient medications on file.  ? ?Allergies  ?Allergen Reactions  ? Penicillins Rash  ? ? ?Past Medical History:  ?Diagnosis Date  ? Diverticulitis   ? Diverticulitis of colon 05/14/2018  ?  ? ?History reviewed. No pertinent surgical history. ? ?Family History  ?Problem Relation Age of Onset  ? Diabetes Mother   ? Hypertension Father   ? ? ?Social History  ? ?Tobacco Use  ? Smoking status: Every Day  ?  Packs/day: 0.50  ?  Years: 20.00  ?  Pack years: 10.00  ?  Types: Cigarettes  ? Smokeless tobacco: Never  ?Vaping Use  ? Vaping Use: Never used  ?Substance Use Topics  ? Alcohol use: Never  ? Drug use: Never  ? ? ?ROS ? ? ?Objective:  ? ?Vitals: ?BP 120/72 (BP Location: Right Arm)   Pulse 84   Temp 98.7 ?F (37.1 ?C) (Oral)   Resp 20   SpO2 96%  ? ?Physical Exam ?Constitutional:   ?   General: He is not in acute distress. ?   Appearance: Normal appearance. He is well-developed. He is obese. He is not ill-appearing, toxic-appearing or diaphoretic.  ?HENT:  ?   Head: Normocephalic and atraumatic.  ?   Right Ear: External ear normal.  ?   Left Ear: External ear normal.  ?   Nose: Nose normal.  ?    Mouth/Throat:  ?   Mouth: Mucous membranes are moist.  ?   Pharynx: Oropharynx is clear.  ?Eyes:  ?   General: No scleral icterus.    ?   Right eye: No discharge.     ?   Left eye: No discharge.  ?   Extraocular Movements: Extraocular movements intact.  ?Cardiovascular:  ?   Rate and Rhythm: Normal rate and regular rhythm.  ?   Heart sounds: Normal heart sounds. No murmur heard. ?  No friction rub. No gallop.  ?Pulmonary:  ?   Effort: Pulmonary effort is normal. No respiratory distress.  ?   Breath sounds: Normal breath sounds. No stridor. No wheezing, rhonchi or rales.  ?Abdominal:  ?   General: Bowel sounds are normal. There is no distension.  ?   Palpations: Abdomen is soft. There is no mass.  ?   Tenderness: There is abdominal tenderness in the right lower quadrant. There is guarding. There is no right CVA tenderness, left CVA tenderness or rebound.  ?Musculoskeletal:  ?   Cervical back: Normal range of motion.  ?Neurological:  ?   Mental Status: He is alert and oriented to person, place, and time.  ?Psychiatric:     ?   Mood and Affect: Mood normal.     ?  Behavior: Behavior normal.     ?   Thought Content: Thought content normal.     ?   Judgment: Judgment normal.  ? ? ?ED ECG REPORT ? ? Date: 05/11/2021 ? EKG Time: 1:06 PM ? Rate: 82bpm ? Rhythm: normal sinus rhythm,  unchanged from previous tracings ? Axis: Normal ? Intervals:none ? ST&T Change: None ? Narrative Interpretation: Sinus rhythm at 82 bpm, no acute findings or changes. ? ? ?Assessment and Plan :  ? ?PDMP not reviewed this encounter. ? ?1. RLQ abdominal pain   ?2. History of diverticulitis   ?3. Diverticulosis of colon without hemorrhage   ? ?Patient is in need of higher level of care than we can provide in the urgent care setting.  He is failing outpatient treatment for clinical diagnosis of recurrent diverticulitis as he just finished a round of antibiotics.  He is now having progressively worsening right lower quadrant abdominal pain that is  severe.  On exam he is guarding.  This forms a CT scan of the abdomen/pelvis.  Recommended that he present to the emergency room now for further evaluation and intervention. ?  ?Wallis Bamberg, PA-C ?05/11/21 1321 ? ?

## 2021-05-11 NOTE — ED Triage Notes (Signed)
Right abdominal pain that radiates around flank to back x 5 days, 0/10 but 10/10 after he eats. Hx of diverticulosis with last flare 2.5 weeks ago, pt reports he finished antibiotics. Denies fevers, n/v/d. ?

## 2021-11-05 DIAGNOSIS — Z91014 Allergy to mammalian meats: Secondary | ICD-10-CM | POA: Diagnosis not present

## 2021-11-05 DIAGNOSIS — E785 Hyperlipidemia, unspecified: Secondary | ICD-10-CM | POA: Diagnosis not present

## 2021-11-05 DIAGNOSIS — Z125 Encounter for screening for malignant neoplasm of prostate: Secondary | ICD-10-CM | POA: Diagnosis not present

## 2021-11-05 DIAGNOSIS — I700 Atherosclerosis of aorta: Secondary | ICD-10-CM | POA: Diagnosis not present

## 2021-11-06 DIAGNOSIS — Z91010 Allergy to peanuts: Secondary | ICD-10-CM | POA: Diagnosis not present

## 2021-11-06 DIAGNOSIS — Z6839 Body mass index (BMI) 39.0-39.9, adult: Secondary | ICD-10-CM | POA: Diagnosis not present

## 2021-11-06 DIAGNOSIS — E785 Hyperlipidemia, unspecified: Secondary | ICD-10-CM | POA: Diagnosis not present

## 2021-11-06 DIAGNOSIS — I700 Atherosclerosis of aorta: Secondary | ICD-10-CM | POA: Diagnosis not present

## 2022-02-14 DIAGNOSIS — Z79899 Other long term (current) drug therapy: Secondary | ICD-10-CM | POA: Diagnosis not present

## 2022-02-14 DIAGNOSIS — E785 Hyperlipidemia, unspecified: Secondary | ICD-10-CM | POA: Diagnosis not present

## 2022-03-28 DIAGNOSIS — I700 Atherosclerosis of aorta: Secondary | ICD-10-CM | POA: Diagnosis not present

## 2022-03-28 DIAGNOSIS — E785 Hyperlipidemia, unspecified: Secondary | ICD-10-CM | POA: Diagnosis not present

## 2022-04-15 IMAGING — CT CT ABD-PELV W/ CM
2 of 5 series · 17 of 46 positions shown, 19 images · IV contrast (Omnipaque or Isovue)
Comparison: 05/30/2017

CLINICAL DATA: Right lower quadrant pain, low pelvic and low back
pain for 5 days

EXAM:
CT ABDOMEN AND PELVIS WITH CONTRAST
TECHNIQUE: Multidetector CT imaging of the abdomen and pelvis was performed
using the standard protocol following bolus administration of
intravenous contrast.

[Series 2: axial st · axial · 0.98mm/px · z∈[+982,+1392]mm · 14 of 94 slices shown, 16 images]
[im 6/94  soft-tissue]
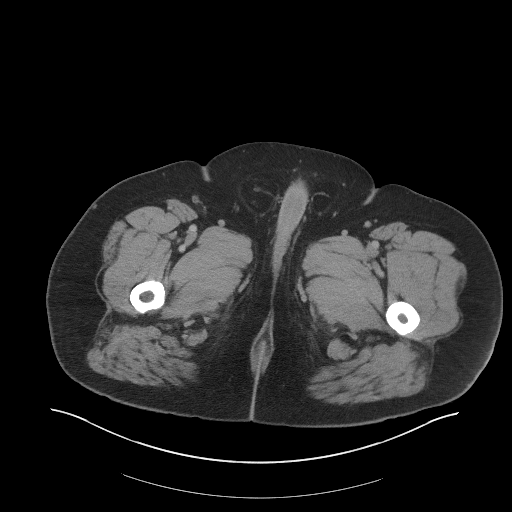
[im 6/94  bone]
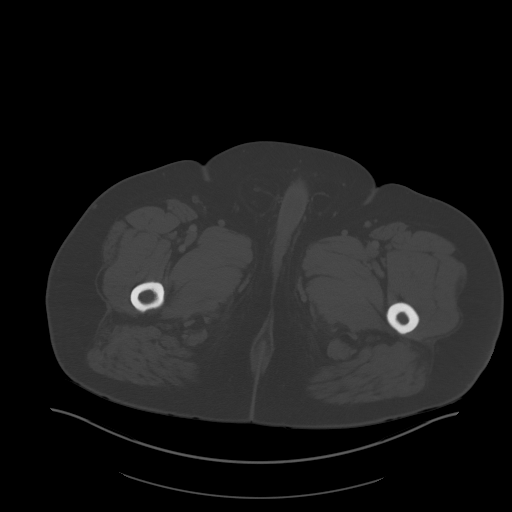
[im 11/94  soft-tissue]
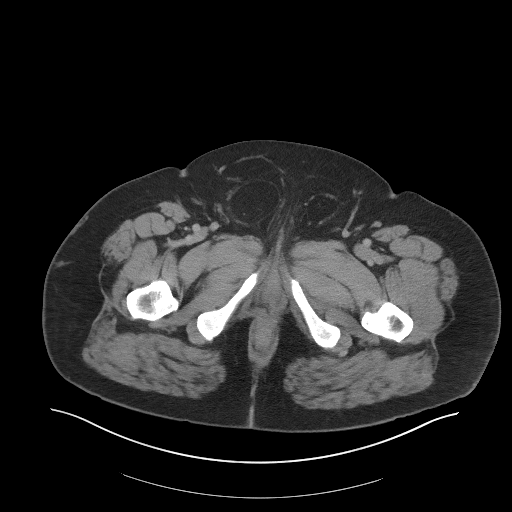
[im 21/94  soft-tissue]
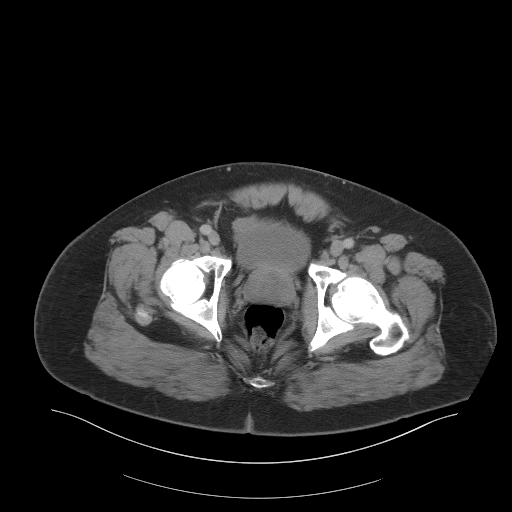
[im 26/94  soft-tissue]
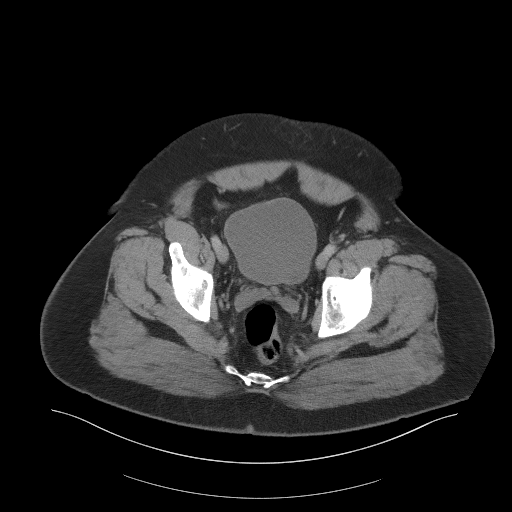
[im 32/94  soft-tissue]
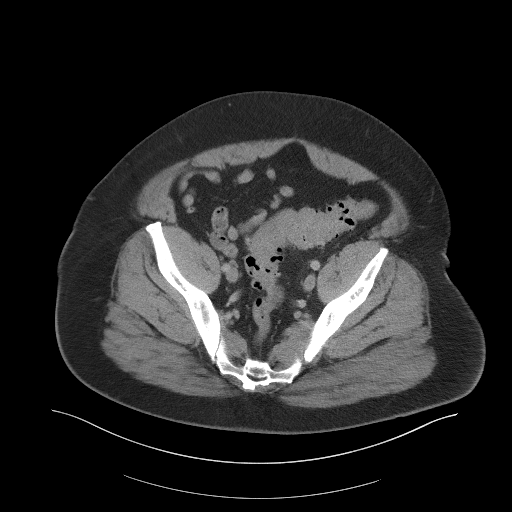
[im 37/94  soft-tissue]
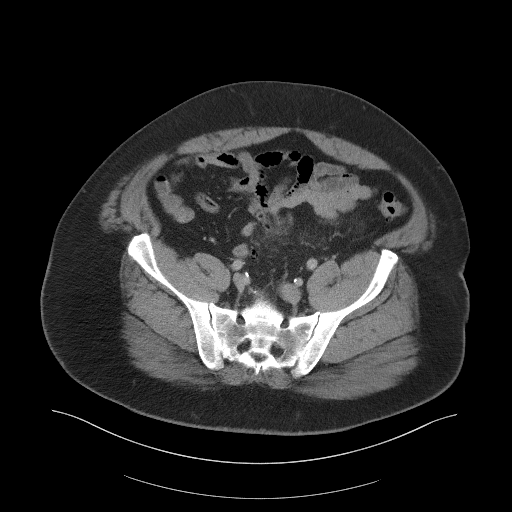
[im 42/94  soft-tissue]
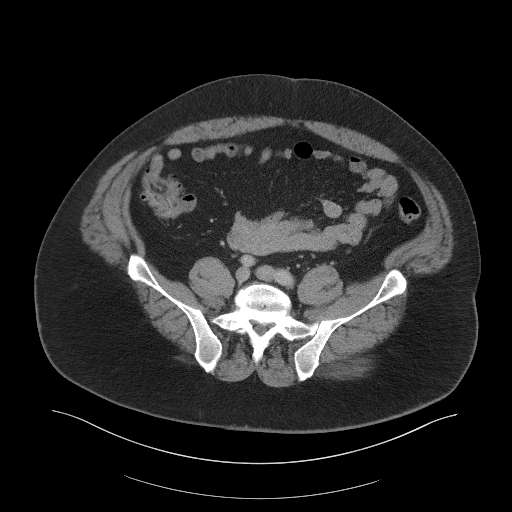
[im 52/94  soft-tissue]
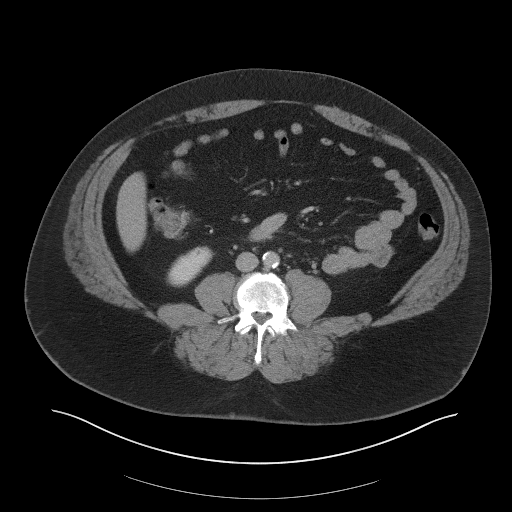
[im 57/94  soft-tissue]
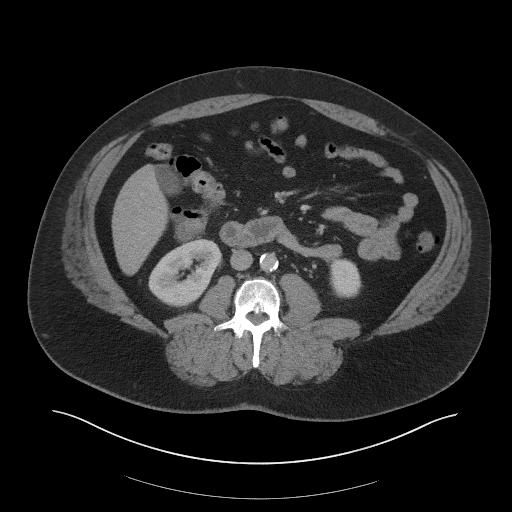
[im 57/94  bone]
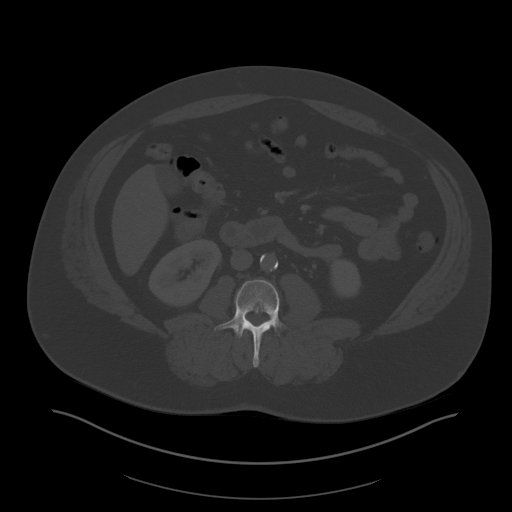
[im 63/94  soft-tissue]
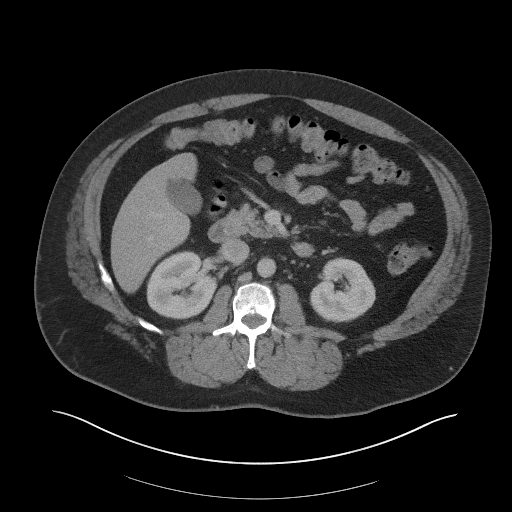
[im 68/94  soft-tissue]
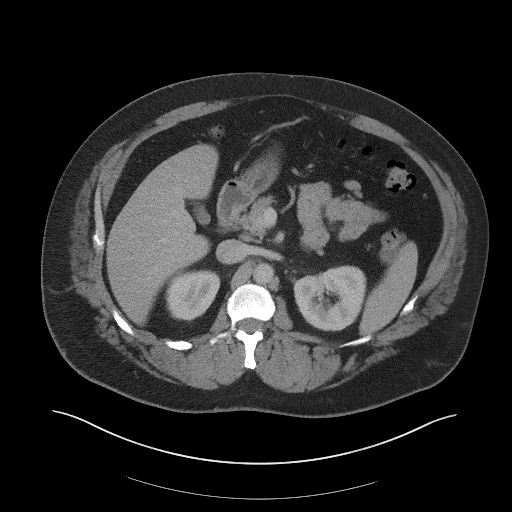
[im 73/94  soft-tissue]
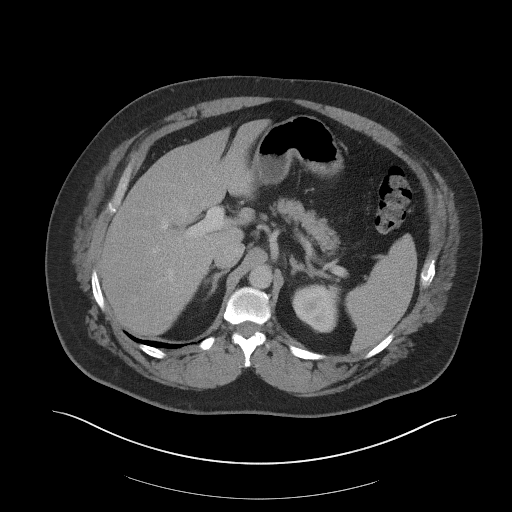
[im 83/94  soft-tissue]
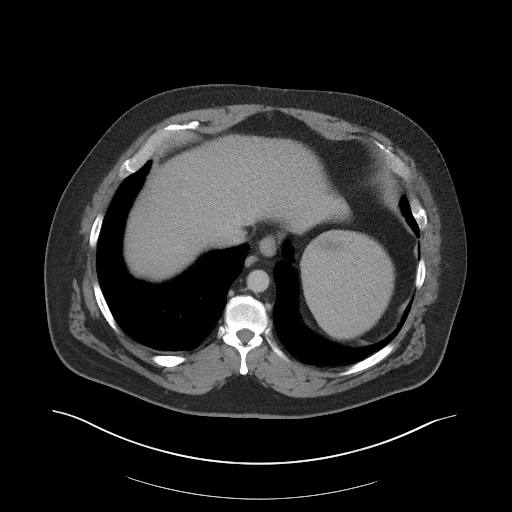
[im 88/94  soft-tissue]
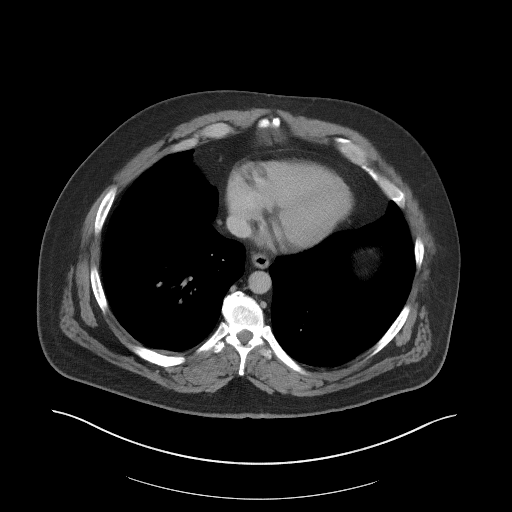

[Series 5: coronal st · coronal · 0.95mm/px · 3 of 125 slices shown]
[im 42/125  soft-tissue]
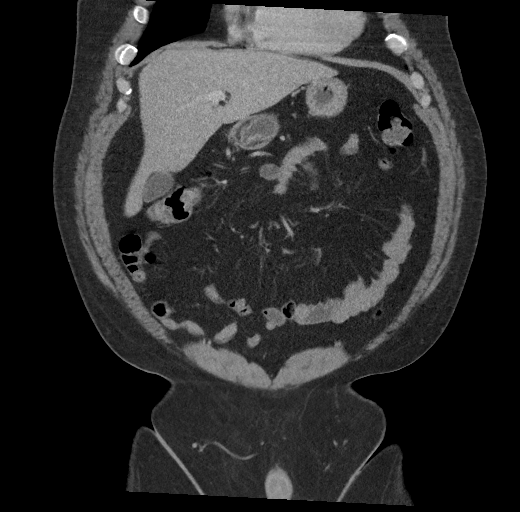
[im 56/125  soft-tissue]
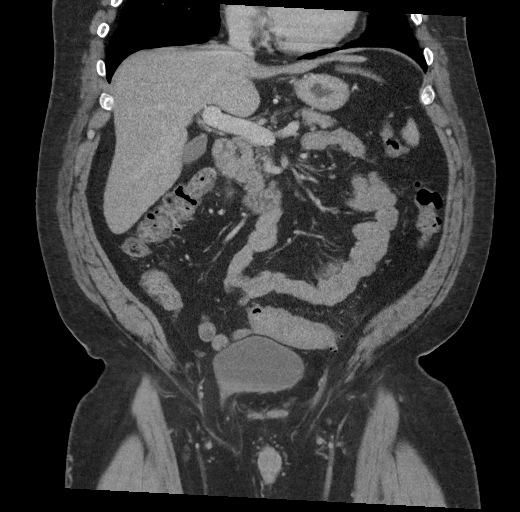
[im 69/125  soft-tissue]
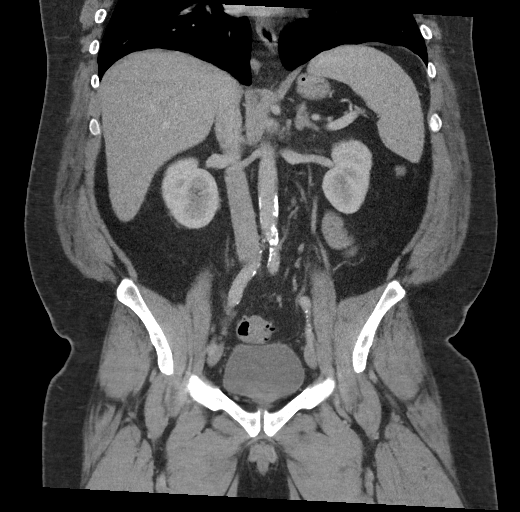

[17 of 46 positions shown; findings below may reference images not displayed]

RADIATION DOSE REDUCTION: This exam was performed according to the
departmental dose-optimization program which includes automated
exposure control, adjustment of the mA and/or kV according to
patient size and/or use of iterative reconstruction technique.

CONTRAST:  100mL OMNIPAQUE IOHEXOL 300 MG/ML  SOLN
FINDINGS: Lower chest: No acute abnormality.

Hepatobiliary: No solid liver abnormality is seen. No gallstones,
gallbladder wall thickening, or biliary dilatation.

Pancreas: Unremarkable. No pancreatic ductal dilatation or
surrounding inflammatory changes.

Spleen: Normal in size without significant abnormality.

Adrenals/Urinary Tract: Adrenal glands are unremarkable. Kidneys are
normal, without renal calculi, solid lesion, or hydronephrosis.
Bladder is unremarkable.

Stomach/Bowel: Stomach is within normal limits. Appendix appears
normal. No evidence of bowel wall thickening, distention, or
inflammatory changes. Descending and sigmoid diverticulosis.

Vascular/Lymphatic: Aortic atherosclerosis. No enlarged abdominal or
pelvic lymph nodes.

Reproductive: Prostatomegaly.

Other: Fat containing bilateral inguinal hernias.  No ascites.

Musculoskeletal: No acute or significant osseous findings.
IMPRESSION: 1. No acute CT findings of the abdomen or pelvis to explain right
lower quadrant or low back pain. Normal appendix.
2. Descending and sigmoid diverticulosis without evidence of acute
diverticulitis.
3. Prostatomegaly.
4. Fat containing bilateral inguinal hernias.

Aortic Atherosclerosis ([id number]).

## 2022-04-15 IMAGING — US US ABDOMEN LIMITED
1 series · 14 of 25 positions shown · non-contrast
Comparison: CT abdomen pelvis dated May 30, 2017.

CLINICAL DATA: Right upper quadrant pain for the past week.

EXAM:
ULTRASOUND ABDOMEN LIMITED RIGHT UPPER QUADRANT

[Series 1: us abdomen limited · 14 of 50 slices shown]
[im 1/50]
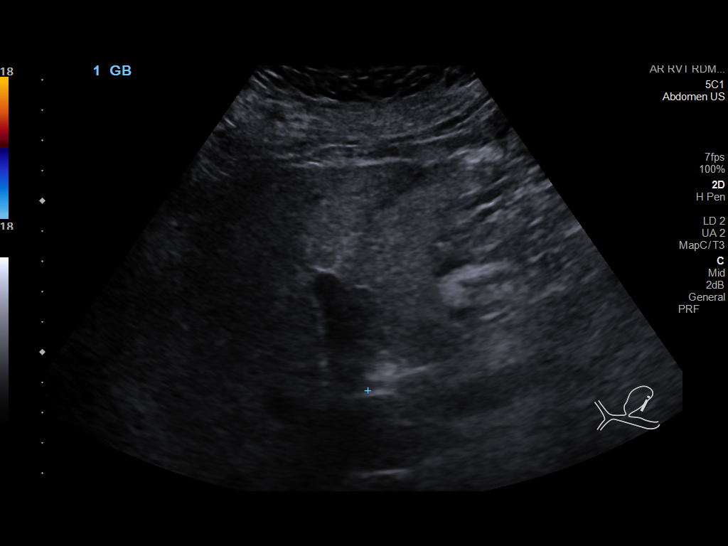
[im 5/50]
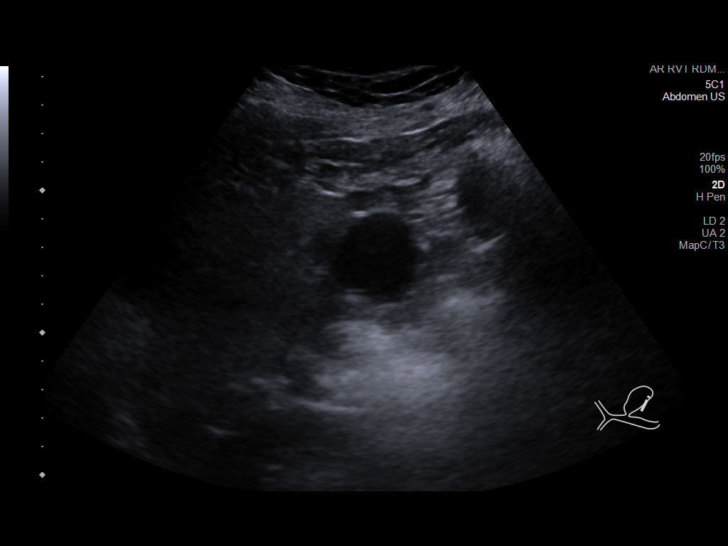
[im 9/50]
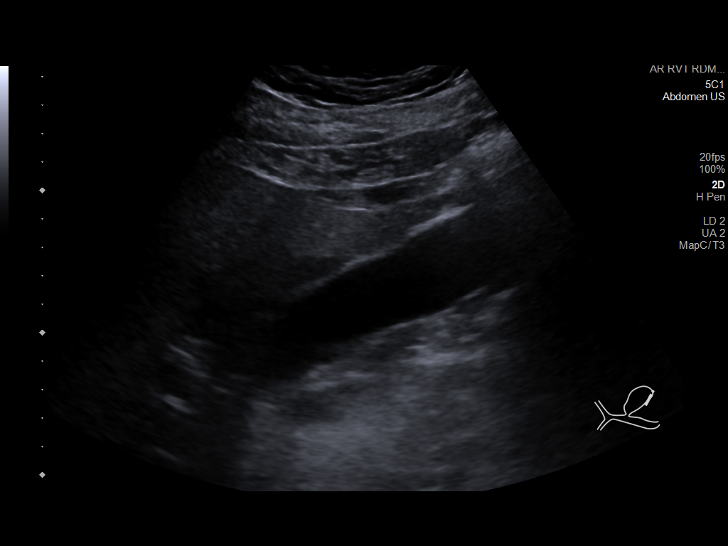
[im 13/50]
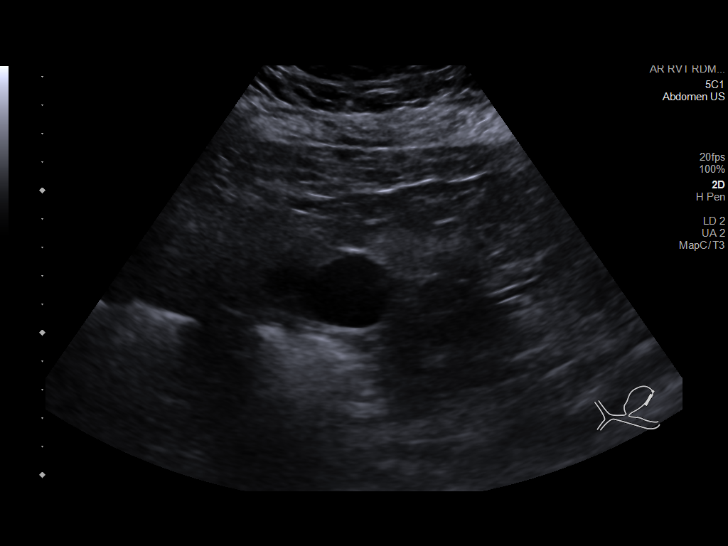
[im 17/50]
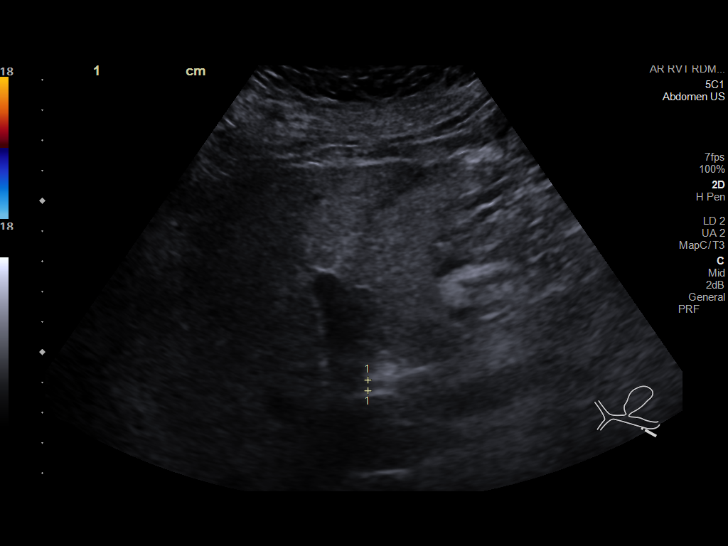
[im 19/50]
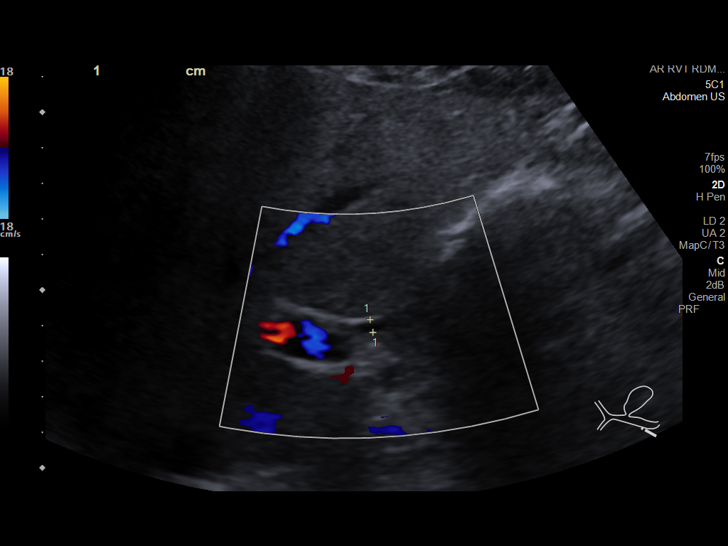
[im 23/50]
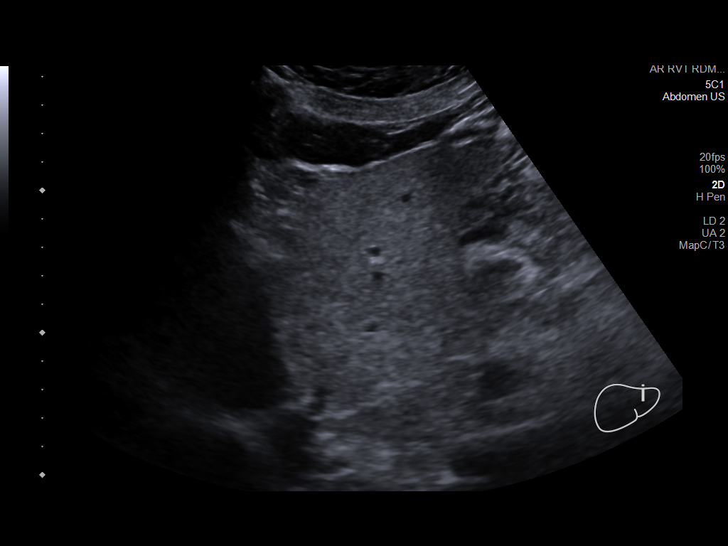
[im 27/50]
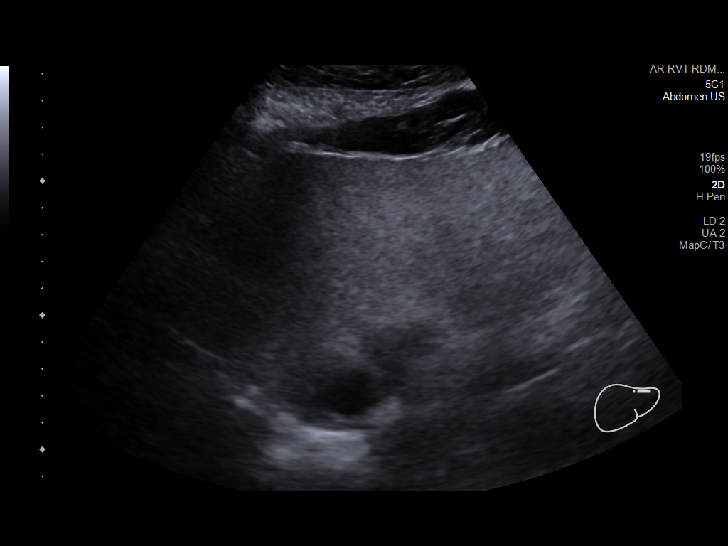
[im 31/50]
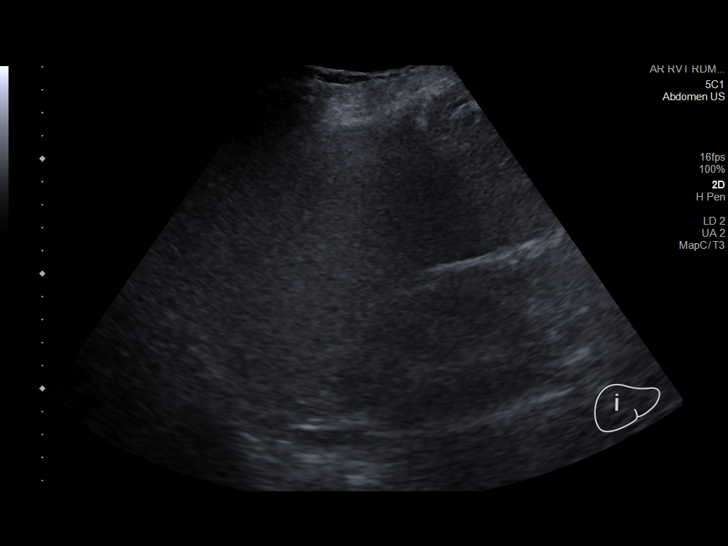
[im 33/50]
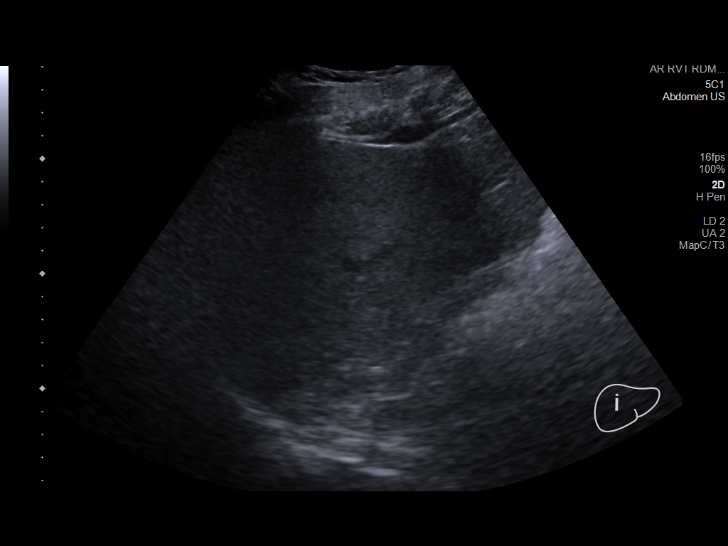
[im 37/50]
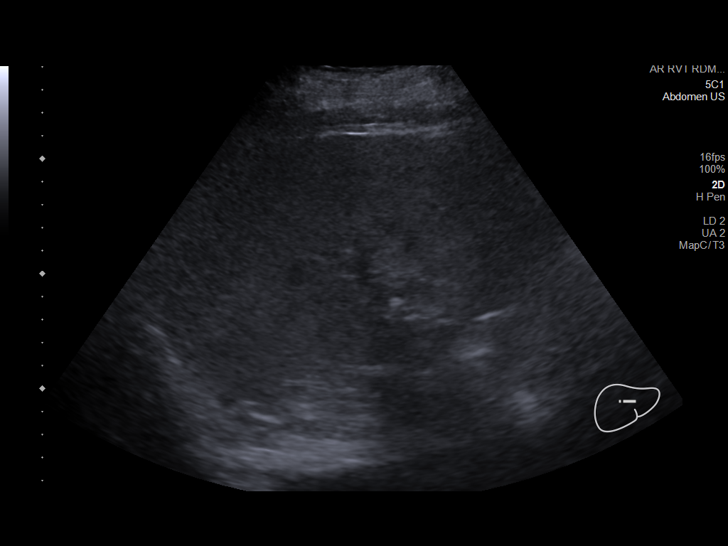
[im 41/50]
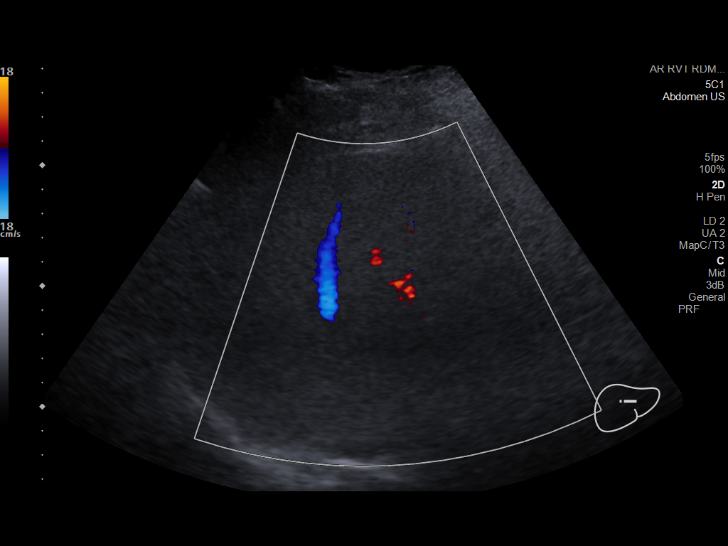
[im 45/50]
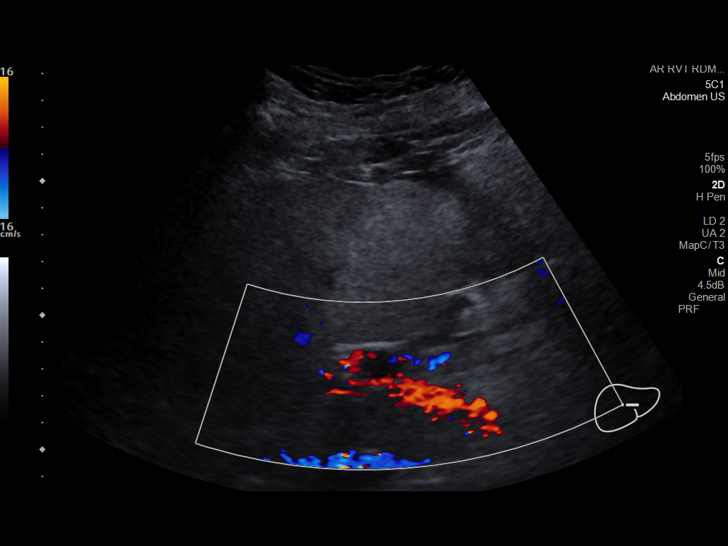
[im 50/50]
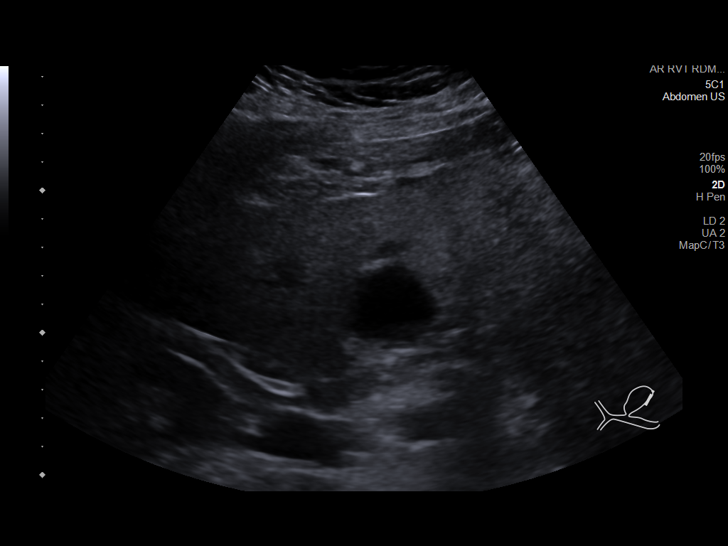

[14 of 25 positions shown; findings below may reference images not displayed]

FINDINGS: Gallbladder:

No gallstones or wall thickening visualized. No sonographic Murphy
sign noted by sonographer.

Common bile duct:

Diameter: 4 mm

Liver:

No focal lesion identified. Increased in parenchymal echogenicity.
Portal vein is patent on color Doppler imaging with normal direction
of blood flow towards the liver.

Other: None.
IMPRESSION: 1. No acute abnormality.
2. Diffusely increased hepatic parenchymal echogenicity,
nonspecific, but suggestive of steatosis.

## 2022-05-13 DIAGNOSIS — K5732 Diverticulitis of large intestine without perforation or abscess without bleeding: Secondary | ICD-10-CM | POA: Diagnosis not present

## 2022-07-08 DIAGNOSIS — D124 Benign neoplasm of descending colon: Secondary | ICD-10-CM | POA: Diagnosis not present

## 2022-07-08 DIAGNOSIS — Z1211 Encounter for screening for malignant neoplasm of colon: Secondary | ICD-10-CM | POA: Diagnosis not present

## 2022-07-08 DIAGNOSIS — K635 Polyp of colon: Secondary | ICD-10-CM | POA: Diagnosis not present

## 2022-08-08 DIAGNOSIS — K5732 Diverticulitis of large intestine without perforation or abscess without bleeding: Secondary | ICD-10-CM | POA: Diagnosis not present

## 2023-02-24 DIAGNOSIS — I700 Atherosclerosis of aorta: Secondary | ICD-10-CM | POA: Diagnosis not present

## 2023-02-24 DIAGNOSIS — Z125 Encounter for screening for malignant neoplasm of prostate: Secondary | ICD-10-CM | POA: Diagnosis not present

## 2023-02-24 DIAGNOSIS — E6609 Other obesity due to excess calories: Secondary | ICD-10-CM | POA: Diagnosis not present

## 2023-02-24 DIAGNOSIS — E785 Hyperlipidemia, unspecified: Secondary | ICD-10-CM | POA: Diagnosis not present

## 2023-02-24 DIAGNOSIS — Z91014 Allergy to mammalian meats: Secondary | ICD-10-CM | POA: Diagnosis not present

## 2023-02-24 DIAGNOSIS — N401 Enlarged prostate with lower urinary tract symptoms: Secondary | ICD-10-CM | POA: Diagnosis not present

## 2023-03-10 DIAGNOSIS — I700 Atherosclerosis of aorta: Secondary | ICD-10-CM | POA: Diagnosis not present

## 2023-03-10 DIAGNOSIS — E785 Hyperlipidemia, unspecified: Secondary | ICD-10-CM | POA: Diagnosis not present

## 2023-03-10 DIAGNOSIS — R7303 Prediabetes: Secondary | ICD-10-CM | POA: Diagnosis not present

## 2023-06-30 DIAGNOSIS — Z79899 Other long term (current) drug therapy: Secondary | ICD-10-CM | POA: Diagnosis not present

## 2023-06-30 DIAGNOSIS — I700 Atherosclerosis of aorta: Secondary | ICD-10-CM | POA: Diagnosis not present

## 2023-06-30 DIAGNOSIS — E785 Hyperlipidemia, unspecified: Secondary | ICD-10-CM | POA: Diagnosis not present

## 2023-06-30 DIAGNOSIS — R7303 Prediabetes: Secondary | ICD-10-CM | POA: Diagnosis not present

## 2023-07-07 DIAGNOSIS — I700 Atherosclerosis of aorta: Secondary | ICD-10-CM | POA: Diagnosis not present

## 2023-07-07 DIAGNOSIS — N471 Phimosis: Secondary | ICD-10-CM | POA: Diagnosis not present

## 2023-09-26 DIAGNOSIS — E669 Obesity, unspecified: Secondary | ICD-10-CM | POA: Diagnosis not present

## 2023-09-26 DIAGNOSIS — Z6838 Body mass index (BMI) 38.0-38.9, adult: Secondary | ICD-10-CM | POA: Diagnosis not present

## 2023-09-26 DIAGNOSIS — R030 Elevated blood-pressure reading, without diagnosis of hypertension: Secondary | ICD-10-CM | POA: Diagnosis not present

## 2023-09-26 DIAGNOSIS — R319 Hematuria, unspecified: Secondary | ICD-10-CM | POA: Diagnosis not present

## 2023-10-27 ENCOUNTER — Ambulatory Visit: Payer: Self-pay

## 2023-10-27 ENCOUNTER — Encounter: Payer: Self-pay | Admitting: Urology

## 2023-10-27 ENCOUNTER — Ambulatory Visit: Admitting: Urology

## 2023-10-27 VITALS — BP 130/75 | HR 92

## 2023-10-27 DIAGNOSIS — N471 Phimosis: Secondary | ICD-10-CM

## 2023-10-27 DIAGNOSIS — R310 Gross hematuria: Secondary | ICD-10-CM | POA: Diagnosis not present

## 2023-10-27 LAB — URINALYSIS, ROUTINE W REFLEX MICROSCOPIC
Bilirubin, UA: NEGATIVE
Glucose, UA: NEGATIVE
Ketones, UA: NEGATIVE
Nitrite, UA: NEGATIVE
Protein,UA: NEGATIVE
RBC, UA: NEGATIVE
Specific Gravity, UA: 1.025 (ref 1.005–1.030)
Urobilinogen, Ur: 0.2 mg/dL (ref 0.2–1.0)
pH, UA: 6 (ref 5.0–7.5)

## 2023-10-27 LAB — MICROSCOPIC EXAMINATION: Bacteria, UA: NONE SEEN

## 2023-10-27 MED ORDER — CLOTRIMAZOLE-BETAMETHASONE 1-0.05 % EX CREA: 1.0000 | TOPICAL_CREAM | Freq: Two times a day (BID) | CUTANEOUS | 1 refills | Status: AC

## 2023-10-27 NOTE — Progress Notes (Unsigned)
 10/27/2023 9:25 AM   Benjamin Curtis 02/20/1971 [id number]  Referring provider: Sheryle Carwin, MD [address] [place],  [place] [address]  No chief complaint on file.   HPI:  New pt -   1) phimosis - can't get foreskin back. Many months. No prior topical. Rips with sexual activity with further exacerbates. 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 Attached underneath. IPSS 3.  2) gross hematuria - noted one time Jul 2025. Voided a small blood clot and red urine and then cleared. He smokes (discussed link between smoking and GU cancers and recommended he quit).   Today, seen for the above.   UA clear.   He is Therapist, [employer] - [employer].   PMH: Past Medical History:  Diagnosis Date   Diverticulitis    Diverticulitis of colon 05/14/2018    Surgical History: No past surgical history on file.  Home Medications:  Allergies as of 10/27/2023       Reactions   Penicillins Rash        Medication List        Accurate as of October 27, 2023  9:25 AM. If you have any questions, ask your nurse or doctor.          STOP taking these medications    acetaminophen 500 MG tablet Commonly known as: TYLENOL Stopped by: Donnice Brooks   naproxen sodium 220 MG tablet Commonly known as: ALEVE Stopped by: Donnice Brooks       TAKE these medications    atorvastatin 20 MG tablet Commonly known as: LIPITOR Take 20 mg by mouth daily.        Allergies:  Allergies  Allergen Reactions   Penicillins Rash    Family History: Family History  Problem Relation Age of Onset   Diabetes Mother    Hypertension Father     Social History:  reports that he has been smoking cigarettes. He has a 10 pack-year smoking history. He has never used smokeless tobacco. He reports that he does not drink alcohol and does not use drugs.   Physical Exam: BP 130/75   Pulse 92   Constitutional:  Alert and oriented, No acute  distress. HEENT: [place] AT, moist mucus membranes.  Trachea midline, no masses. Cardiovascular: No clubbing, cyanosis, or edema. Respiratory: Normal respiratory effort, no increased work of breathing. GI: Abdomen is soft, nontender, nondistended, no abdominal masses GU: No CVA tenderness Skin: No rashes, bruises or suspicious lesions. Neurologic: Grossly intact, no focal deficits, moving all 4 extremities. Psychiatric: Normal mood and affect. 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 I can see glans (can't deliver it) and it appears normal and meatus appears normal.   Laboratory Data: Lab Results  Component Value Date   WBC 7.0 05/11/2021   HGB 15.6 05/11/2021   HCT 48.3 05/11/2021   MCV 90.1 05/11/2021   PLT 346 05/11/2021    Lab Results  Component Value Date   CREATININE 0.85 05/11/2021    No results found for: PSA  No results found for: TESTOSTERONE  No results found for: HGBA1C  Urinalysis No results found for: COLORURINE, APPEARANCEUR, LABSPEC, PHURINE, GLUCOSEU, HGBUR, BILIRUBINUR, KETONESUR, PROTEINUR, UROBILINOGEN, NITRITE, LEUKOCYTESUR  No results found for: LABMICR, WBCUA, RBCUA, LABEPIT, MUCUS, BACTERIA  Pertinent Imaging: N/a   Assessment & Plan:    Phimosis - mycolog type ointment given. He would need recon GU consult  for buried penis repair.   Gross hematuria - recommended CT and cystoscopy.   No follow-ups on file.  Donnice Brooks, MD  [facility]  [address] [place], [place] [address] [phone]

## 2023-12-08 ENCOUNTER — Ambulatory Visit (HOSPITAL_COMMUNITY)

## 2023-12-29 ENCOUNTER — Other Ambulatory Visit: Admitting: Urology

## 2024-01-19 ENCOUNTER — Ambulatory Visit (HOSPITAL_COMMUNITY)

## 2024-03-08 ENCOUNTER — Other Ambulatory Visit: Admitting: Urology
